# Patient Record
Sex: Male | Born: 1935 | Race: White | Hispanic: No | Marital: Married | Smoking: Never smoker
Health system: Southern US, Community
[De-identification: ages and names within clinical notes are randomized; demographics above are authoritative.]

## PROBLEM LIST (undated history)

## (undated) DIAGNOSIS — C189 Malignant neoplasm of colon, unspecified: Secondary | ICD-10-CM

## (undated) DIAGNOSIS — N400 Enlarged prostate without lower urinary tract symptoms: Secondary | ICD-10-CM

## (undated) DIAGNOSIS — F0390 Unspecified dementia without behavioral disturbance: Secondary | ICD-10-CM

## (undated) DIAGNOSIS — I10 Essential (primary) hypertension: Secondary | ICD-10-CM

## (undated) DIAGNOSIS — I5030 Unspecified diastolic (congestive) heart failure: Secondary | ICD-10-CM

## (undated) DIAGNOSIS — I4892 Unspecified atrial flutter: Secondary | ICD-10-CM

## (undated) DIAGNOSIS — M869 Osteomyelitis, unspecified: Secondary | ICD-10-CM

## (undated) HISTORY — PX: COLON SURGERY: SHX602

---

## 2009-05-17 ENCOUNTER — Emergency Department (HOSPITAL_BASED_OUTPATIENT_CLINIC_OR_DEPARTMENT_OTHER): Admission: EM | Admit: 2009-05-17 | Discharge: 2009-05-18 | Payer: Self-pay | Admitting: Emergency Medicine

## 2009-05-17 ENCOUNTER — Ambulatory Visit: Payer: Self-pay | Admitting: Diagnostic Radiology

## 2010-09-29 LAB — DIFFERENTIAL
Basophils Relative: 2 % — ABNORMAL HIGH (ref 0–1)
Eosinophils Absolute: 0.2 10*3/uL (ref 0.0–0.7)
Eosinophils Relative: 4 % (ref 0–5)
Monocytes Absolute: 0.5 10*3/uL (ref 0.1–1.0)
Monocytes Relative: 12 % (ref 3–12)

## 2010-09-29 LAB — BASIC METABOLIC PANEL
CO2: 29 mEq/L (ref 19–32)
Chloride: 106 mEq/L (ref 96–112)
Creatinine, Ser: 1.1 mg/dL (ref 0.4–1.5)
GFR calc Af Amer: >60 mL/min (ref >60)
Sodium: 144 mEq/L (ref 135–145)

## 2010-09-29 LAB — CBC
Hemoglobin: 14.1 g/dL (ref 13.0–17.0)
MCHC: 34.4 g/dL (ref 30.0–36.0)
MCV: 97.2 fL (ref 78.0–100.0)
RBC: 4.22 MIL/uL (ref 4.22–5.81)

## 2012-10-19 ENCOUNTER — Emergency Department (HOSPITAL_BASED_OUTPATIENT_CLINIC_OR_DEPARTMENT_OTHER)
Admission: EM | Admit: 2012-10-19 | Discharge: 2012-10-19 | Disposition: A | Discharge status: Home / Self Care | Payer: Medicare Other | Attending: Emergency Medicine | Admitting: Emergency Medicine

## 2012-10-19 ENCOUNTER — Encounter (HOSPITAL_BASED_OUTPATIENT_CLINIC_OR_DEPARTMENT_OTHER): Payer: Self-pay | Admitting: *Deleted

## 2012-10-19 DIAGNOSIS — R141 Gas pain: Secondary | ICD-10-CM | POA: Insufficient documentation

## 2012-10-19 DIAGNOSIS — R339 Retention of urine, unspecified: Secondary | ICD-10-CM | POA: Insufficient documentation

## 2012-10-19 DIAGNOSIS — I10 Essential (primary) hypertension: Secondary | ICD-10-CM | POA: Insufficient documentation

## 2012-10-19 DIAGNOSIS — Z859 Personal history of malignant neoplasm, unspecified: Secondary | ICD-10-CM | POA: Insufficient documentation

## 2012-10-19 DIAGNOSIS — R142 Eructation: Secondary | ICD-10-CM | POA: Insufficient documentation

## 2012-10-19 DIAGNOSIS — Z79899 Other long term (current) drug therapy: Secondary | ICD-10-CM | POA: Insufficient documentation

## 2012-10-19 DIAGNOSIS — R143 Flatulence: Secondary | ICD-10-CM | POA: Insufficient documentation

## 2012-10-19 LAB — URINALYSIS, ROUTINE W REFLEX MICROSCOPIC
Bilirubin Urine: NEGATIVE
Hgb urine dipstick: NEGATIVE
Specific Gravity, Urine: 1.017 (ref 1.005–1.030)
pH: 6 (ref 5.0–8.0)

## 2012-10-19 NOTE — ED Notes (Signed)
Urinary retention.

## 2012-10-19 NOTE — ED Provider Notes (Signed)
History     CSN: [id number]  Arrival date & time 10/19/12  1706   First MD Initiated Contact with Patient 10/19/12 1748      Chief Complaint  Patient presents with  . Urinary Retention    (Consider location/radiation/quality/duration/timing/severity/associated sxs/prior treatment) HPI  79 year old male presents today with wife and daughter state he has not been able to void his last night. He noted some increased urine output through the night and states he has only been able to void a small amount this morning and has not voided since then. He has a history of retention and states his feel like it did then. He has not had any nausea, vomiting, fever, or chills. He feels that he has some suprapubic fullness. He has eaten as usual today. He has a urologist and his primary care Dr. is Dr. Tresa Endo.  Past Medical History  Diagnosis Date  . Hypertension   . Cancer     Past Surgical History  Procedure Laterality Date  . Colon surgery      No family history on file.  History  Substance Use Topics  . Smoking status: Never Smoker   . Smokeless tobacco: Not on file  . Alcohol Use: No      Review of Systems  All other systems reviewed and are negative.    Allergies  Review of patient's allergies indicates no known allergies.  Home Medications   Current Outpatient Rx  Name  Route  Sig  Dispense  Refill  . furosemide (LASIX) 80 MG tablet   Oral   Take 80 mg by mouth 2 (two) times daily.         Marland Kitchen lisinopril (PRINIVIL,ZESTRIL) 20 MG tablet   Oral   Take 20 mg by mouth daily.           BP 155/74  Pulse 57  Temp(Src) 97.6 F (36.4 C) (Oral)  Resp 20  SpO2 94%  Physical Exam  Nursing note and vitals reviewed. Constitutional: He is oriented to person, place, and time. He appears well-developed and well-nourished.  Elderly male sitting on bed is not appear to be in any acute distress  HENT:  Head: Normocephalic and atraumatic.  Right Ear: External ear  normal.  Left Ear: External ear normal.  Nose: Nose normal.  Mouth/Throat: Oropharynx is clear and moist.  Eyes: Conjunctivae and EOM are normal.  Neck: Normal range of motion. Neck supple.  Cardiovascular: Normal rate and regular rhythm.   Pulmonary/Chest: Effort normal.  Abdominal: Soft.  Suprapubic distention consistent with her nares tension  Musculoskeletal: Normal range of motion.  Neurological: He is alert and oriented to person, place, and time. He has normal reflexes.  Skin: Skin is warm and dry.  Psychiatric: He has a normal mood and affect.    ED Course  Procedures (including critical care time)  Labs Reviewed  URINALYSIS, ROUTINE W REFLEX MICROSCOPIC   No results found.   No diagnosis found.  Foley catheter placed and patient has had her cc of urine output with resolution of symptoms. Results for orders placed during the hospital encounter of 10/19/12  URINALYSIS, ROUTINE W REFLEX MICROSCOPIC      Result Value Range   Color, Urine YELLOW  YELLOW   APPearance CLEAR  CLEAR   Specific Gravity, Urine 1.017  1.005 - 1.030   pH 6.0  5.0 - 8.0   Glucose, UA NEGATIVE  NEGATIVE mg/dL   Hgb urine dipstick NEGATIVE  NEGATIVE   Bilirubin Urine NEGATIVE  NEGATIVE   Ketones, ur NEGATIVE  NEGATIVE mg/dL   Protein, ur NEGATIVE  NEGATIVE mg/dL   Urobilinogen, UA 0.2  0.0 - 1.0 mg/dL   Nitrite NEGATIVE  NEGATIVE   Leukocytes, UA NEGATIVE  NEGATIVE      MDM  Patient without acute urinary retention relieved with Foley catheter placement. He does not have any blood, leukocytes in his urine. Urine will be cultured. Plan leg bag. I discussed with patient, daughter, and wife. He will keep the catheter in place until followup with urology on Monday or Tuesday. We have discussed signs and symptoms to return to the emergency department for including any worsening abdominal pain, fever, chills or any other concerns.       Hilario Quarry, MD 10/19/12 [phone]

## 2012-10-19 NOTE — ED Notes (Signed)
MD at bedside. 

## 2012-10-19 NOTE — ED Notes (Signed)
Pt reports feeling better after catheter insertion. Pt has put out urine. Pt also c/o "soreness" across upper abd.

## 2012-10-19 NOTE — ED Notes (Signed)
Foley bag changed to a leg bag and instructed family on use. EMT at bedside dressing patient now.

## 2014-05-20 ENCOUNTER — Telehealth: Payer: Self-pay | Admitting: Cardiovascular Disease

## 2014-05-20 NOTE — Telephone Encounter (Signed)
Kenneth Edwards at [facility] informed pt. Would have to get this medication from his PCP

## 2014-05-20 NOTE — Telephone Encounter (Signed)
Pt.s wife called and wanted a refill on his Tramadol

## 2014-11-01 ENCOUNTER — Emergency Department (HOSPITAL_COMMUNITY): Payer: Medicare Other

## 2014-11-01 ENCOUNTER — Inpatient Hospital Stay (HOSPITAL_COMMUNITY): Payer: Medicare Other

## 2014-11-01 ENCOUNTER — Encounter (HOSPITAL_COMMUNITY): Payer: Self-pay | Admitting: Emergency Medicine

## 2014-11-01 ENCOUNTER — Inpatient Hospital Stay (HOSPITAL_COMMUNITY)
Admission: EM | Admit: 2014-11-01 | Discharge: 2014-11-04 | DRG: 292 | Disposition: A | Discharge status: Skilled Nursing Facility | Payer: Medicare Other | Attending: Internal Medicine | Admitting: Internal Medicine

## 2014-11-01 DIAGNOSIS — I10 Essential (primary) hypertension: Secondary | ICD-10-CM

## 2014-11-01 DIAGNOSIS — I5033 Acute on chronic diastolic (congestive) heart failure: Principal | ICD-10-CM | POA: Diagnosis present

## 2014-11-01 DIAGNOSIS — Z9181 History of falling: Secondary | ICD-10-CM

## 2014-11-01 DIAGNOSIS — Z7901 Long term (current) use of anticoagulants: Secondary | ICD-10-CM | POA: Diagnosis not present

## 2014-11-01 DIAGNOSIS — I129 Hypertensive chronic kidney disease with stage 1 through stage 4 chronic kidney disease, or unspecified chronic kidney disease: Secondary | ICD-10-CM | POA: Diagnosis present

## 2014-11-01 DIAGNOSIS — I509 Heart failure, unspecified: Secondary | ICD-10-CM | POA: Diagnosis not present

## 2014-11-01 DIAGNOSIS — Z66 Do not resuscitate: Secondary | ICD-10-CM | POA: Diagnosis present

## 2014-11-01 DIAGNOSIS — K219 Gastro-esophageal reflux disease without esophagitis: Secondary | ICD-10-CM | POA: Diagnosis present

## 2014-11-01 DIAGNOSIS — L03116 Cellulitis of left lower limb: Secondary | ICD-10-CM | POA: Diagnosis present

## 2014-11-01 DIAGNOSIS — I483 Typical atrial flutter: Secondary | ICD-10-CM | POA: Diagnosis not present

## 2014-11-01 DIAGNOSIS — I4892 Unspecified atrial flutter: Secondary | ICD-10-CM | POA: Diagnosis present

## 2014-11-01 DIAGNOSIS — I110 Hypertensive heart disease with heart failure: Secondary | ICD-10-CM | POA: Diagnosis present

## 2014-11-01 DIAGNOSIS — Z7982 Long term (current) use of aspirin: Secondary | ICD-10-CM | POA: Diagnosis not present

## 2014-11-01 DIAGNOSIS — E119 Type 2 diabetes mellitus without complications: Secondary | ICD-10-CM | POA: Diagnosis present

## 2014-11-01 DIAGNOSIS — F0390 Unspecified dementia without behavioral disturbance: Secondary | ICD-10-CM | POA: Diagnosis not present

## 2014-11-01 DIAGNOSIS — N183 Chronic kidney disease, stage 3 unspecified: Secondary | ICD-10-CM | POA: Diagnosis present

## 2014-11-01 DIAGNOSIS — T502X5A Adverse effect of carbonic-anhydrase inhibitors, benzothiadiazides and other diuretics, initial encounter: Secondary | ICD-10-CM | POA: Diagnosis present

## 2014-11-01 DIAGNOSIS — I872 Venous insufficiency (chronic) (peripheral): Secondary | ICD-10-CM | POA: Diagnosis present

## 2014-11-01 DIAGNOSIS — F0391 Unspecified dementia with behavioral disturbance: Secondary | ICD-10-CM | POA: Diagnosis present

## 2014-11-01 DIAGNOSIS — R0602 Shortness of breath: Secondary | ICD-10-CM | POA: Insufficient documentation

## 2014-11-01 DIAGNOSIS — N400 Enlarged prostate without lower urinary tract symptoms: Secondary | ICD-10-CM | POA: Diagnosis present

## 2014-11-01 DIAGNOSIS — N170 Acute kidney failure with tubular necrosis: Secondary | ICD-10-CM | POA: Diagnosis not present

## 2014-11-01 DIAGNOSIS — C189 Malignant neoplasm of colon, unspecified: Secondary | ICD-10-CM | POA: Insufficient documentation

## 2014-11-01 DIAGNOSIS — N179 Acute kidney failure, unspecified: Secondary | ICD-10-CM | POA: Insufficient documentation

## 2014-11-01 DIAGNOSIS — R001 Bradycardia, unspecified: Secondary | ICD-10-CM | POA: Diagnosis not present

## 2014-11-01 DIAGNOSIS — I4891 Unspecified atrial fibrillation: Secondary | ICD-10-CM | POA: Diagnosis present

## 2014-11-01 DIAGNOSIS — I8312 Varicose veins of left lower extremity with inflammation: Secondary | ICD-10-CM | POA: Diagnosis not present

## 2014-11-01 DIAGNOSIS — Z85038 Personal history of other malignant neoplasm of large intestine: Secondary | ICD-10-CM | POA: Diagnosis not present

## 2014-11-01 DIAGNOSIS — N19 Unspecified kidney failure: Secondary | ICD-10-CM

## 2014-11-01 DIAGNOSIS — F03918 Unspecified dementia, unspecified severity, with other behavioral disturbance: Secondary | ICD-10-CM | POA: Diagnosis present

## 2014-11-01 DIAGNOSIS — R531 Weakness: Secondary | ICD-10-CM | POA: Diagnosis not present

## 2014-11-01 DIAGNOSIS — I8311 Varicose veins of right lower extremity with inflammation: Secondary | ICD-10-CM | POA: Diagnosis not present

## 2014-11-01 HISTORY — DX: Benign prostatic hyperplasia without lower urinary tract symptoms: N40.0

## 2014-11-01 HISTORY — DX: Unspecified atrial flutter: I48.92

## 2014-11-01 HISTORY — DX: Essential (primary) hypertension: I10

## 2014-11-01 HISTORY — DX: Malignant neoplasm of colon, unspecified: C18.9

## 2014-11-01 HISTORY — DX: Unspecified dementia, unspecified severity, without behavioral disturbance, psychotic disturbance, mood disturbance, and anxiety: F03.90

## 2014-11-01 LAB — COMPREHENSIVE METABOLIC PANEL
ALBUMIN: 3.2 g/dL — AB (ref 3.5–5.0)
ALK PHOS: 69 U/L (ref 38–126)
ALT: 9 U/L — ABNORMAL LOW (ref 17–63)
AST: 16 U/L (ref 15–41)
Anion gap: 6 (ref 5–15)
BILIRUBIN TOTAL: 0.6 mg/dL (ref 0.3–1.2)
BUN: 46 mg/dL — ABNORMAL HIGH (ref 6–20)
CHLORIDE: 107 mmol/L (ref 101–111)
CO2: 23 mmol/L (ref 22–32)
Calcium: 8.1 mg/dL — ABNORMAL LOW (ref 8.9–10.3)
Creatinine, Ser: 1.66 mg/dL — ABNORMAL HIGH (ref 0.61–1.24)
GFR calc Af Amer: 39 mL/min — ABNORMAL LOW (ref >60)
GFR calc non Af Amer: 34 mL/min — ABNORMAL LOW (ref >60)
Glucose, Bld: 99 mg/dL (ref 70–99)
POTASSIUM: 4.2 mmol/L (ref 3.5–5.1)
Sodium: 136 mmol/L (ref 135–145)
Total Protein: 6.1 g/dL — ABNORMAL LOW (ref 6.5–8.1)

## 2014-11-01 LAB — CBC WITH DIFFERENTIAL/PLATELET
Basophils Absolute: 0 10*3/uL (ref 0.0–0.1)
Basophils Relative: 1 % (ref 0–1)
EOS PCT: 3 % (ref 0–5)
Eosinophils Absolute: 0.2 10*3/uL (ref 0.0–0.7)
HEMATOCRIT: 37.9 % — AB (ref 39.0–52.0)
Hemoglobin: 12.4 g/dL — ABNORMAL LOW (ref 13.0–17.0)
LYMPHS ABS: 1.5 10*3/uL (ref 0.7–4.0)
LYMPHS PCT: 25 % (ref 12–46)
MCH: 32.3 pg (ref 26.0–34.0)
MCHC: 32.7 g/dL (ref 30.0–36.0)
MCV: 98.7 fL (ref 78.0–100.0)
MONO ABS: 0.9 10*3/uL (ref 0.1–1.0)
Monocytes Relative: 15 % — ABNORMAL HIGH (ref 3–12)
Neutro Abs: 3.4 10*3/uL (ref 1.7–7.7)
Neutrophils Relative %: 56 % (ref 43–77)
Platelets: 226 10*3/uL (ref 150–400)
RBC: 3.84 MIL/uL — AB (ref 4.22–5.81)
RDW: 14.5 % (ref 11.5–15.5)
WBC: 6 10*3/uL (ref 4.0–10.5)

## 2014-11-01 LAB — URINALYSIS, ROUTINE W REFLEX MICROSCOPIC
Bilirubin Urine: NEGATIVE
Glucose, UA: NEGATIVE mg/dL
Hgb urine dipstick: NEGATIVE
Ketones, ur: NEGATIVE mg/dL
LEUKOCYTES UA: NEGATIVE
NITRITE: NEGATIVE
PH: 5 (ref 5.0–8.0)
Protein, ur: NEGATIVE mg/dL
SPECIFIC GRAVITY, URINE: 1.012 (ref 1.005–1.030)
UROBILINOGEN UA: 0.2 mg/dL (ref 0.0–1.0)

## 2014-11-01 LAB — BRAIN NATRIURETIC PEPTIDE: B NATRIURETIC PEPTIDE 5: 365.9 pg/mL — AB (ref 0.0–100.0)

## 2014-11-01 LAB — TROPONIN I: Troponin I: 0.03 ng/mL (ref <0.031)

## 2014-11-01 LAB — TSH: TSH: 0.951 u[IU]/mL (ref 0.350–4.500)

## 2014-11-01 MED ORDER — ONDANSETRON HCL 4 MG PO TABS: 4.0000 mg | ORAL_TABLET | Freq: Four times a day (QID) | ORAL | Status: DC | PRN

## 2014-11-01 MED ORDER — SODIUM CHLORIDE 0.9 % IJ SOLN
3.0000 mL | Freq: Two times a day (BID) | INTRAMUSCULAR | Status: DC
  Administered 2014-11-01 – 2014-11-04 (×7): 3 mL via INTRAVENOUS

## 2014-11-01 MED ORDER — VITAMIN D3 25 MCG (1000 UNIT) PO TABS
1000.0000 [IU] | ORAL_TABLET | Freq: Every day | ORAL | Status: DC
  Administered 2014-11-01 – 2014-11-04 (×4): 1000 [IU] via ORAL
  Filled 2014-11-01 (×5): qty 1

## 2014-11-01 MED ORDER — ONDANSETRON HCL 4 MG/2ML IJ SOLN: 4.0000 mg | Freq: Three times a day (TID) | INTRAMUSCULAR | Status: DC | PRN

## 2014-11-01 MED ORDER — DIVALPROEX SODIUM 125 MG PO CSDR
125.0000 mg | DELAYED_RELEASE_CAPSULE | Freq: Every day | ORAL | Status: DC
  Administered 2014-11-01 – 2014-11-04 (×4): 125 mg via ORAL
  Filled 2014-11-01 (×5): qty 1

## 2014-11-01 MED ORDER — GUAIFENESIN-DM 100-10 MG/5ML PO SYRP: 5.0000 mL | ORAL_SOLUTION | ORAL | Status: DC | PRN

## 2014-11-01 MED ORDER — HEPARIN SODIUM (PORCINE) 5000 UNIT/ML IJ SOLN
5000.0000 [IU] | Freq: Three times a day (TID) | INTRAMUSCULAR | Status: DC
  Administered 2014-11-01 – 2014-11-04 (×9): 5000 [IU] via SUBCUTANEOUS
  Filled 2014-11-01 (×12): qty 1

## 2014-11-01 MED ORDER — FUROSEMIDE 10 MG/ML IJ SOLN
60.0000 mg | Freq: Two times a day (BID) | INTRAMUSCULAR | Status: DC
  Administered 2014-11-01 – 2014-11-02 (×2): 60 mg via INTRAVENOUS
  Filled 2014-11-01 (×4): qty 6

## 2014-11-01 MED ORDER — ONDANSETRON HCL 4 MG/2ML IJ SOLN: 4.0000 mg | Freq: Four times a day (QID) | INTRAMUSCULAR | Status: DC | PRN

## 2014-11-01 MED ORDER — TAMSULOSIN HCL 0.4 MG PO CAPS
0.4000 mg | ORAL_CAPSULE | Freq: Every day | ORAL | Status: DC
  Administered 2014-11-01 – 2014-11-04 (×4): 0.4 mg via ORAL
  Filled 2014-11-01 (×4): qty 1

## 2014-11-01 MED ORDER — PANTOPRAZOLE SODIUM 40 MG PO TBEC
40.0000 mg | DELAYED_RELEASE_TABLET | Freq: Every day | ORAL | Status: DC
  Administered 2014-11-01 – 2014-11-04 (×4): 40 mg via ORAL
  Filled 2014-11-01 (×3): qty 1

## 2014-11-01 MED ORDER — LORAZEPAM 0.5 MG PO TABS: 0.5000 mg | ORAL_TABLET | Freq: Three times a day (TID) | ORAL | Status: DC | PRN

## 2014-11-01 MED ORDER — ASPIRIN EC 81 MG PO TBEC
81.0000 mg | DELAYED_RELEASE_TABLET | Freq: Every day | ORAL | Status: DC
  Administered 2014-11-01 – 2014-11-04 (×4): 81 mg via ORAL
  Filled 2014-11-01 (×5): qty 1

## 2014-11-01 MED ORDER — HYDROCODONE-ACETAMINOPHEN 5-325 MG PO TABS
1.0000 | ORAL_TABLET | ORAL | Status: DC | PRN
  Administered 2014-11-02 – 2014-11-03 (×4): 2 via ORAL
  Filled 2014-11-01 (×4): qty 2

## 2014-11-01 MED ORDER — METOLAZONE 2.5 MG PO TABS
2.5000 mg | ORAL_TABLET | Freq: Every day | ORAL | Status: DC
  Administered 2014-11-01 – 2014-11-02 (×2): 2.5 mg via ORAL
  Filled 2014-11-01 (×3): qty 1

## 2014-11-01 MED ORDER — FUROSEMIDE 10 MG/ML IJ SOLN
40.0000 mg | INTRAMUSCULAR | Status: AC
  Administered 2014-11-01: 40 mg via INTRAVENOUS
  Filled 2014-11-01: qty 4

## 2014-11-01 NOTE — ED Notes (Signed)
Hospitalist bedside 

## 2014-11-01 NOTE — ED Provider Notes (Signed)
CSN: [id number]     Arrival date & time 11/01/14  6073 History   First MD Initiated Contact with Patient 11/01/14 [phone]     Chief Complaint  Patient presents with  . Leg Swelling     (Consider location/radiation/quality/duration/timing/severity/associated sxs/prior Treatment) HPI Comments: 81 year old male, history of hypertension and cancer, status post colon surgery in the past, lives by himself, is on Lasix and lisinopril. He had a nurse come to his house today to evaluate his legs for wound care, he was found to have weeping edema of his bilateral lower extremities and was transferred to the hospital for evaluation of same. The patient denies any other complaints except for generalized weakness which has been progressive over the last month since his wife passed away. There is no fevers chills nausea vomiting diarrhea coughing shortness of breath back pain headache or difficulty with vision. The patient has no known history of heart disease or congestive heart failure.  The history is provided by the patient.    Past Medical History  Diagnosis Date  . Hypertension   . Cancer    Past Surgical History  Procedure Laterality Date  . Colon surgery     History reviewed. No pertinent family history. History  Substance Use Topics  . Smoking status: Never Smoker   . Smokeless tobacco: Not on file  . Alcohol Use: No    Review of Systems  All other systems reviewed and are negative.     Allergies  Review of patient's allergies indicates no known allergies.  Home Medications   Prior to Admission medications   Medication Sig Start Date End Date Taking? Authorizing Provider  acetaminophen (TYLENOL) 500 MG tablet Take 500 mg by mouth every 6 (six) hours as needed for mild pain.   Yes Historical Provider, MD  cholecalciferol (VITAMIN D) 1000 UNITS tablet Take 1,000 Units by mouth daily.   Yes Historical Provider, MD  divalproex (DEPAKOTE SPRINKLE) 125 MG capsule Take 125 mg by mouth  daily.   Yes Historical Provider, MD  furosemide (LASIX) 20 MG tablet Take 60 mg by mouth 2 (two) times daily.   Yes Historical Provider, MD  lisinopril (PRINIVIL,ZESTRIL) 20 MG tablet Take 20 mg by mouth daily.   Yes Historical Provider, MD  LORazepam (ATIVAN) 0.5 MG tablet Take 0.5 mg by mouth every 8 (eight) hours as needed for anxiety.   Yes Historical Provider, MD  tamsulosin (FLOMAX) 0.4 MG CAPS capsule Take 0.4 mg by mouth daily.    Yes Historical Provider, MD  traMADol (ULTRAM) 50 MG tablet Take 1-2 tablets by mouth every 8 hours for pain 08/18/14  Yes Historical Provider, MD  Vitamin D, Ergocalciferol, (DRISDOL) 50000 UNITS CAPS capsule Take 50,000 Units by mouth every 7 (seven) days.   Yes Historical Provider, MD   BP 107/47 mmHg  Pulse 55  Temp(Src) 97.8 F (36.6 C) (Oral)  Resp 16  SpO2 96% Physical Exam  Constitutional: He appears well-developed and well-nourished. No distress.  HENT:  Head: Normocephalic and atraumatic.  Mouth/Throat: Oropharynx is clear and moist. No oropharyngeal exudate.  Eyes: Conjunctivae and EOM are normal. Pupils are equal, round, and reactive to light. Right eye exhibits no discharge. Left eye exhibits no discharge. No scleral icterus.  Neck: Normal range of motion. Neck supple. No JVD present. No thyromegaly present.  Cardiovascular: Normal rate, regular rhythm, normal heart sounds and intact distal pulses.  Exam reveals no gallop and no friction rub.   No murmur heard. Pulmonary/Chest: Effort normal. No  respiratory distress. He has no wheezes. He has rales (subtle rales at the bases, no increased work of breathing).  Abdominal: Soft. Bowel sounds are normal. He exhibits no distension and no mass. There is no tenderness.  Very soft abdomen, no tenderness, no guarding, mild tympanitic sounds to percussion of the mid abdomen  Musculoskeletal: Normal range of motion. He exhibits edema (severe bilateral pitting edema below the knees through the feet,  symmetrical, erythematous, several areas of weeping serous fluid). He exhibits no tenderness.  Lymphadenopathy:    He has no cervical adenopathy.  Neurological: He is alert. Coordination normal.  The patient has difficulty with hearing, he reads lips, he follows commands without difficulty, normal strength, cranial nerves III through XII intact  Skin: Skin is warm and dry. No rash noted. There is erythema.  Lower extremities with bilateral erythema over the edematous areas, no warmth or induration  Psychiatric: He has a normal mood and affect. His behavior is normal.  Nursing note and vitals reviewed.   ED Course  Procedures (including critical care time) Labs Review Labs Reviewed  BRAIN NATRIURETIC PEPTIDE - Abnormal; Notable for the following:    B Natriuretic Peptide 365.9 (*)    All other components within normal limits  COMPREHENSIVE METABOLIC PANEL - Abnormal; Notable for the following:    BUN 46 (*)    Creatinine, Ser 1.66 (*)    Calcium 8.1 (*)    Total Protein 6.1 (*)    Albumin 3.2 (*)    ALT 9 (*)    GFR calc non Af Amer 34 (*)    GFR calc Af Amer 39 (*)    All other components within normal limits  CBC WITH DIFFERENTIAL/PLATELET - Abnormal; Notable for the following:    RBC 3.84 (*)    Hemoglobin 12.4 (*)    HCT 37.9 (*)    Monocytes Relative 15 (*)    All other components within normal limits  TROPONIN I  URINALYSIS, ROUTINE W REFLEX MICROSCOPIC  TSH  TROPONIN I  TROPONIN I  TROPONIN I    Imaging Review Dg Chest 2 View  11/01/2014   CLINICAL DATA:  Bilateral leg swelling for 1 month. Open wounds. History of colon cancer. Ex-smoker.  EXAM: CHEST  2 VIEW  COMPARISON:  05/17/2009.  FINDINGS: The cardiac silhouette remains near the upper limit of normal in size. Clear lungs with the exception of stable minimal linear scarring at the left lateral lung base. Moderate scoliosis is unchanged. Interval thoracolumbar vertebroplasty material.  IMPRESSION: No acute  abnormality.   Electronically Signed   By: Claudie Revering M.D.   On: 11/01/2014 11:07     EKG Interpretation   Date/Time:  Saturday Nov 01 2014 10:14:10 EDT Ventricular Rate:  47 PR Interval:    QRS Duration: 121 QT Interval:  455 QTC Calculation: 402 R Axis:   81 Text Interpretation:  Atrial flutter with bradycardia rate controlled  Nonspecific T wave abnormality Abnormal ekg No old tracing to compare  Confirmed by Nyal Schachter  MD, Javius Sylla ([id number]) on 11/01/2014 10:47:26 AM      MDM   Final diagnoses:  SOB (shortness of breath)  Acute renal failure, unspecified acute renal failure type  Atrial flutter, unspecified    The patient has what appears to be severe bilateral lower extremity edema without any other signs of significant volume overload. Prior chest x-rays have shown bilateral atelectasis or scarring at the bases, he does not have JVD, is not orthopneic or short of breath,  we will evaluate with labs and chest x-ray, check renal function as he is on high doses of Lasix and an ACE inhibitor, he does not appear focally weak, generalized weakness could be from multiple etiologies including poor by mouth intake, renal failure, uremia, coronary ischemia.  Labs reviewed, shows acute renal failure with mild uremia, chest x-ray without acute findings, the patient has a BNP of close to 400, I am concerned given his relative bradycardia and atrial flutter, he is also on medications which could worsen renal failure, he likely needs diuresis, this will be done inpatient, discussed with hospitalist who agrees.  Meds given in ED:  Medications  furosemide (LASIX) injection 40 mg (40 mg Intravenous Given 11/01/14 1150)    New Prescriptions   No medications on file      Noemi Chapel, MD 11/01/14 1152

## 2014-11-01 NOTE — H&P (Addendum)
Patient Demographics  Kenneth Edwards, is a 81 y.o. male  MRN: [id number]   DOB - 1919/10/17  Admit Date - 11/01/2014  Outpatient Primary MD for the patient is Dr. Elvina Mattes  With History of -  Past Medical History  Diagnosis Date  . Essential hypertension   . Colon cancer   . Atrial flutter   . Dementia   . BPH (benign prostatic hyperplasia)       Past Surgical History  Procedure Laterality Date  . Colon surgery      in for   Chief Complaint  Patient presents with  . Leg Swelling     HPI  Kenneth Edwards  is a 81 y.o. male, with history of atrial flutter [place] VASC - 3 who was taken off of Coumadin few years ago because of multiple falls, essential hypertension, dementia, history of colon cancer status post resection 30 years ago,he claims that it might have reoccurred but is not sure who told him that, BPH whose wife died about a month ago and he now lives in assisted living facility primary care physician Dr. Elvina Mattes.  He comes to the ER with chief complaints of massive swelling in both legs and some fatigue, in the ER workup consistent of atrial flutter with slow ventricular response, massive lower extremity edema, elevated proBNP with relatively stable chest x-ray, renal insufficiency unclear baseline and I was called to admit the patient.   In the ER is relatively stable, pleasantly demented, besides lower extremity edema and generalized weakness he has no subjective complaints, denies any fever chills, no headache, no chest pain or abdominal pain, no cough phlegm shortness of breath. No orthopnea. He does say that intermittently he notices some blood in his stool. He thinks that someone told him few months ago that his colon cancer might have reoccurred which was operated and removed about 30 years ago. He  denies any unintentional weight loss. No history of strokes.    Review of Systems    In addition to the HPI above,  No Fever-chills, No Headache, No changes with Vision or hearing, No problems swallowing food or Liquids, No Chest pain, Cough or Shortness of Breath, No Abdominal pain, No Nausea or Vommitting, Bowel movements are regular, No Blood in stool or Urine, No dysuria, No new skin rashes or bruises, No new joints pains-aches,  No new weakness, tingling, numbness in any extremity, he agrees to massive swelling in both lower extremities and some generalized weakness No recent weight gain or loss, No polyuria, polydypsia or polyphagia, No significant Mental Stressors.  A full 10 point Review of Systems was done, except as stated above, all other Review of Systems were negative.   Social History History  Substance Use Topics  . Smoking status: Never Smoker   . Smokeless tobacco: Not on file  . Alcohol Use: No    Lives - lives alone at her assisted living facility  Mobility -  walks with a walker     Family History No family history of CAD at an early age  Prior to Admission medications   Medication Sig Start Date End Date Taking? Authorizing Provider  acetaminophen (TYLENOL) 500 MG tablet Take 500 mg by mouth every 6 (six) hours as needed for mild pain.   Yes Historical Provider, MD  cholecalciferol (VITAMIN D) 1000 UNITS tablet Take 1,000 Units by mouth daily.   Yes Historical Provider, MD  divalproex (DEPAKOTE SPRINKLE) 125 MG capsule Take 125 mg by mouth daily.   Yes Historical Provider, MD  furosemide (LASIX) 20 MG tablet Take 60 mg by mouth 2 (two) times daily.   Yes Historical Provider, MD  lisinopril (PRINIVIL,ZESTRIL) 20 MG tablet Take 20 mg by mouth daily.   Yes Historical Provider, MD  LORazepam (ATIVAN) 0.5 MG tablet Take 0.5 mg by mouth every 8 (eight) hours as needed for anxiety.   Yes Historical Provider, MD  tamsulosin (FLOMAX) 0.4 MG CAPS capsule  Take 0.4 mg by mouth daily.    Yes Historical Provider, MD  traMADol (ULTRAM) 50 MG tablet Take 1-2 tablets by mouth every 8 hours for pain 08/18/14  Yes Historical Provider, MD  Vitamin D, Ergocalciferol, (DRISDOL) 50000 UNITS CAPS capsule Take 50,000 Units by mouth every 7 (seven) days.   Yes Historical Provider, MD    No Known Allergies  Physical Exam  Vitals  Blood pressure 107/47, pulse 55, temperature 97.8 F (36.6 C), temperature source Oral, resp. rate 16, SpO2 96 %.   1. General elderly pleasantly demented white male lying in bed in NAD,  he's extremely hard of hearing  2. Normal affect and insight, Not Suicidal or Homicidal, Awake , Oriented X 1.  3. No F.N deficits, ALL C.Nerves Intact, Strength 5/5 all 4 extremities, Sensation intact all 4 extremities, Plantars down going.  4. Ears and Eyes appear Normal, Conjunctivae clear, PERRLA. Moist Oral Mucosa.  5. Supple Neck, No JVD, No cervical lymphadenopathy appriciated, No Carotid Bruits.  6. Symmetrical Chest wall movement, Good air movement bilaterally, CTAB.  7. RRR, No Gallops, Rubs or Murmurs, No Parasternal Heave.  8. Positive Bowel Sounds, Abdomen Soft, No tenderness, No organomegaly appriciated,No rebound -guarding or rigidity.  9.  No Cyanosis, Normal Skin Turgor, 3+ leg edema, with chronic erythema and some small weeping areas, no warmth  10. Good muscle tone,  joints appear normal , no effusions, Normal ROM.  11. No Palpable Lymph Nodes in Neck or Axillae     Data Review  CBC  Recent Labs Lab 11/01/14 1038  WBC 6.0  HGB 12.4*  HCT 37.9*  PLT 226  MCV 98.7  MCH 32.3  MCHC 32.7  RDW 14.5  LYMPHSABS 1.5  MONOABS 0.9  EOSABS 0.2  BASOSABS 0.0   ------------------------------------------------------------------------------------------------------------------  Chemistries   Recent Labs Lab 11/01/14 1038  NA 136  K 4.2  CL 107  CO2 23  GLUCOSE 99  BUN 46*  CREATININE 1.66*  CALCIUM  8.1*  AST 16  ALT 9*  ALKPHOS 69  BILITOT 0.6   ------------------------------------------------------------------------------------------------------------------ CrCl cannot be calculated (Unknown ideal weight.). ------------------------------------------------------------------------------------------------------------------ No results for input(s): TSH, T4TOTAL, T3FREE, THYROIDAB in the last 72 hours.  Invalid input(s): FREET3   Coagulation profile No results for input(s): INR, PROTIME in the last 168 hours. ------------------------------------------------------------------------------------------------------------------- No results for input(s): DDIMER in the last 72 hours. -------------------------------------------------------------------------------------------------------------------  Cardiac Enzymes  Recent Labs Lab 11/01/14 1038  TROPONINI 0.03   ------------------------------------------------------------------------------------------------------------------ Invalid input(s): POCBNP   ---------------------------------------------------------------------------------------------------------------  Urinalysis    Component Value Date/Time   COLORURINE YELLOW 10/19/2012 1810   APPEARANCEUR CLEAR 10/19/2012 1810   LABSPEC 1.017 10/19/2012 1810   PHURINE 6.0 10/19/2012 1810   GLUCOSEU NEGATIVE 10/19/2012 1810   HGBUR NEGATIVE 10/19/2012 1810   BILIRUBINUR NEGATIVE 10/19/2012 1810   KETONESUR NEGATIVE 10/19/2012 1810   PROTEINUR NEGATIVE 10/19/2012 1810   UROBILINOGEN 0.2 10/19/2012 1810   NITRITE NEGATIVE 10/19/2012 1810   LEUKOCYTESUR NEGATIVE 10/19/2012 1810    ----------------------------------------------------------------------------------------------------------------  Imaging results:   Dg Chest 2 View  11/01/2014   CLINICAL DATA:  Bilateral leg swelling for 1 month. Open wounds. History of colon cancer. Ex-smoker.  EXAM: CHEST  2 VIEW  COMPARISON:   05/17/2009.  FINDINGS: The cardiac silhouette remains near the upper limit of normal in size. Clear lungs with the exception of stable minimal linear scarring at the left lateral lung base. Moderate scoliosis is unchanged. Interval thoracolumbar vertebroplasty material.  IMPRESSION: No acute abnormality.   Electronically Signed   By: Claudie Revering M.D.   On: 11/01/2014 11:07    My personal review of EKG: Rhythm A.flutter, Rate  47 /min, non specific ST changes    Assessment & Plan   1. Bilateral lower extremity edema due to CHF decompensation. Acute on chronic. Unclear type as no echogram in the chart. Due to relatively clear lung fields this might be largely right-sided failure. He will be admitted to a telemetry bed, salt and fluid restriction, Zaroxolyn along with IV Lasix, daily weights, intake output monitor. Apply condom catheter. Monitor BMP closely. Blood pressure low for hydralazine, no beta blocker/Calcium channel blocker as heart rate low.  2. Atrial flutter with slow ventricular response [place] VASC - 3  - slow ventricular response could be causing his fatigue, we'll check TSH, cycle troponin, telemetry monitor, check echogram. Aspirin for now, note he was taken off of Coumadin 3 years ago due to falls. His cardiologist was in [facility] he does not recall his name. Case discussed with cardiologist on-call doctor skeins who requested patient be sent to [facility] for close cardiology and possible EP follow-up. May require pacemaker based on his overall clinical progress. Although DO NOT RESUSCITATE he seems to have at least a decent quality of life for his age.  3. BPH. Continue Flomax.  4. Dementia. At risk for delirium. Minimize benzodiazepines and narcotics.  5. History of colon cancer. Status post resection 30 years ago. Denies any unintentional weight loss, some intermittent bright red blood per stool, placing on 81 mg of aspirin for #2 above along with PPI. If any evidence of bleed  we will discontinue even the aspirin. Will defer outpatient workup for any recurrence of colon cancer to PCP, doubt he is a candidate for extensive workup, chemoradiation or surgery.  6. Essential hypertension. Blood pressure is soft. We will hold off medications for now.  7. Renal failure. No baseline kidney function in the system. At present he definitely needs diuresis due to massive leg edema, with diabetes monitor BMP. Will check a baseline renal ultrasound. Repeat BMP in the morning.     DVT Prophylaxis Heparin    AM Labs Ordered, also please review Full Orders  Family Communication: Admission, patients condition and plan of care including tests being ordered have been discussed with the patient and daughter who indicate understanding and agree with the plan and Code Status.  Code Status DNR  Likely DC to  ALF/SNF  Condition GUARDED    Time spent in minutes :  22    Thurnell Lose M.D on 11/01/2014 at 12:26 PM  Between 7am to 7pm - Pager - [phone]  After 7pm go to [email or web address] - password [facility]  [facility]  Office  [phone]

## 2014-11-01 NOTE — ED Notes (Signed)
Unable to void

## 2014-11-01 NOTE — ED Notes (Signed)
PER EMS- pt picked up from [facility] c/o bilateral leg swelling x1 month.  Weeping profusely, with open sores noted. Non complaint with wound care.  Home health nurse came out to the home today and requested that pt be evaluated before she starts treating pt regularly.  Reports 4/10 intermittent pain.  Alert and oriented per baseline.  Pt will be transferred to an assisted nursing facility next week. Wife recently died. Pt is DNR.

## 2014-11-01 NOTE — Consult Note (Signed)
Admit date: 11/01/2014 Referring Physician  Candiss Norse Primary Physician No primary care provider on file. Primary Cardiologist  [place] Reason for Consultation  ? pacer  HPI: 81 year old male with dementia, atrial flutter taken off of Coumadin a few years ago because of multiple falls with remote history of colon cancer resected 30 years ago that might have reoccurred but unsure who told him that his wife died about a month ago and lives in assisted living facility taken care of by Dr. Elvina Mattes here with chief complaint of swelling in both legs as well as some fatigue. Atrial fibrillation was noted on EKG with slow ventricular response, heart rate 47 bpm.  He is not on any rate slowing agents. He does have a cardiologist in [facility] but he does not remember his name. We were asked to see him for the possibility of pacemaker. Unknown EF.  Daughter is present. He is very pleasant currently eating his  Cobler in bed without any difficulty. No shortness of breath.  PMH:   Past Medical History  Diagnosis Date  . Essential hypertension   . Colon cancer   . Atrial flutter   . Dementia   . BPH (benign prostatic hyperplasia)     PSH:   Past Surgical History  Procedure Laterality Date  . Colon surgery     Allergies:  Review of patient's allergies indicates no known allergies. Prior to Admit Meds:   Prior to Admission medications   Medication Sig Start Date End Date Taking? Authorizing Provider  acetaminophen (TYLENOL) 500 MG tablet Take 500 mg by mouth every 6 (six) hours as needed for mild pain.   Yes Historical Provider, MD  cholecalciferol (VITAMIN D) 1000 UNITS tablet Take 1,000 Units by mouth daily.   Yes Historical Provider, MD  divalproex (DEPAKOTE SPRINKLE) 125 MG capsule Take 125 mg by mouth daily.   Yes Historical Provider, MD  furosemide (LASIX) 20 MG tablet Take 60 mg by mouth 2 (two) times daily.   Yes Historical Provider, MD  lisinopril (PRINIVIL,ZESTRIL) 20 MG tablet Take  20 mg by mouth daily.   Yes Historical Provider, MD  LORazepam (ATIVAN) 0.5 MG tablet Take 0.5 mg by mouth every 8 (eight) hours as needed for anxiety.   Yes Historical Provider, MD  tamsulosin (FLOMAX) 0.4 MG CAPS capsule Take 0.4 mg by mouth daily.    Yes Historical Provider, MD  traMADol (ULTRAM) 50 MG tablet Take 1-2 tablets by mouth every 8 hours for pain 08/18/14  Yes Historical Provider, MD  Vitamin D, Ergocalciferol, (DRISDOL) 50000 UNITS CAPS capsule Take 50,000 Units by mouth every 7 (seven) days.   Yes Historical Provider, MD   Fam HX:   History reviewed. No pertinent family history. Social HX:    History   Social History  . Marital Status: Married    Spouse Name: N/A  . Number of Children: N/A  . Years of Education: N/A   Occupational History  . Not on file.   Social History Main Topics  . Smoking status: Never Smoker   . Smokeless tobacco: Not on file  . Alcohol Use: No  . Drug Use: No  . Sexual Activity: Not on file   Other Topics Concern  . Not on file   Social History Narrative     ROS:  All 11 ROS were addressed and are negative except what is stated in the HPI   Physical Exam: Blood pressure 139/55, pulse 53, temperature 98.2 F (36.8 C), temperature source  Oral, resp. rate 16, height 5\' 10"  (1.778 m), weight 151 lb 7.3 oz (68.7 kg), SpO2 94 %.   General: Elderly, in no acute distress Head: Eyes PERRLA, No xanthomas.   Normal cephalic and atramatic  Lungs:   Clear bilaterally to auscultation and percussion. Normal respiratory effort. No wheezes, no rales. Heart:  Bradycardic, fairly regular S1 S2 Pulses are 2+ & equal. No murmur, rubs, gallops.  No carotid bruit. No JVD.  No abdominal bruits.  Abdomen: Bowel sounds are positive, abdomen soft and non-tender without masses. No hepatosplenomegaly. Msk:  Back normal. Normal strength and tone for age. Extremities:  2-3+ edema lower extremities bilaterally Neuro: Dementia, non-focal, MAE x 4 GU:  Deferred Rectal: Deferred Psych:  Very pleasant disposition.      Labs: Lab Results  Component Value Date   WBC 6.0 11/01/2014   HGB 12.4* 11/01/2014   HCT 37.9* 11/01/2014   MCV 98.7 11/01/2014   PLT 226 11/01/2014     Recent Labs Lab 11/01/14 1038  NA 136  K 4.2  CL 107  CO2 23  BUN 46*  CREATININE 1.66*  CALCIUM 8.1*  PROT 6.1*  BILITOT 0.6  ALKPHOS 69  ALT 9*  AST 16  GLUCOSE 99    Recent Labs  11/01/14 1038  TROPONINI 0.03   No results found for: CHOL, HDL, LDLCALC, TRIG Lab Results  Component Value Date   DDIMER * 05/17/2009    1.02        AT THE INHOUSE ESTABLISHED CUTOFF VALUE OF 0.48 ug/mL FEU, THIS ASSAY HAS BEEN DOCUMENTED IN THE LITERATURE TO HAVE A SENSITIVITY AND NEGATIVE PREDICTIVE VALUE OF AT LEAST 98 TO 99%.  THE TEST RESULT SHOULD BE CORRELATED WITH AN ASSESSMENT OF THE CLINICAL PROBABILITY OF DVT / VTE.     Radiology:  Dg Chest 2 View  11/01/2014   CLINICAL DATA:  Bilateral leg swelling for 1 month. Open wounds. History of colon cancer. Ex-smoker.  EXAM: CHEST  2 VIEW  COMPARISON:  05/17/2009.  FINDINGS: The cardiac silhouette remains near the upper limit of normal in size. Clear lungs with the exception of stable minimal linear scarring at the left lateral lung base. Moderate scoliosis is unchanged. Interval thoracolumbar vertebroplasty material.  IMPRESSION: No acute abnormality.   Electronically Signed   By: Claudie Revering M.D.   On: 11/01/2014 11:07   US Renal  11/01/2014   CLINICAL DATA:  Renal failure.  EXAM: RENAL / URINARY TRACT ULTRASOUND COMPLETE  COMPARISON:  CT 05/19/2009.  FINDINGS: Right Kidney:  Length: 10.7 cm. Renal cortical thinning. Upper pole cyst measuring 14 mm x 8 mm x 12 mm. No obstruction. No calculi.  Left Kidney:  Length: 10.4 cm. Renal cortical thinning compatible with medical renal disease. Lower pole cyst measuring 29 mm x 22 mm x 24 mm.  Bladder:  Bladder volume 762 mL. Bladder wall diverticula and cellules  compatible with chronic bladder outflow obstruction. No bladder stones. By report, patient voided 600 mL prior to exam.  Small amount of ascites is incidentally noted suggesting volume overload.  IMPRESSION: 1. Renal cortical thinning and echogenicity compatible with medical renal disease. No obstruction. 2. Bladder trabeculation compatible with chronic bladder outflow obstruction. Postvoid bladder volume 762 mL, consistent with chronic obstruction.   Electronically Signed   By: Dereck Ligas M.D.   On: 11/01/2014 13:35   Personally viewed.  EKG:  Atrial fibrillation with slow ventricular response, heart rate 47 bpm, poor R-wave progression Personally viewed.  ASSESSMENT/PLAN:    81 year old male with dementia, prior history of colon cancer, chronic kidney disease presumed stage III, history of atrial fibrillation off of Coumadin because of multiple falls here with lower extremity edema and bradycardia, atrial fibrillation with slow ventricular response heart rate 47.  1. Bradycardia-currently heart rate in the low 50s. TSH pending. Avoid AV nodal blocking agents. At this time, I would continue monitoring him. I do not think that pacemaker placement is absolutely necessary given his moderate bradycardia. We will monitor. Discussed with daughter.  2. Dementia-per primary team, this also complicates possible invasive procedure  3. Chronic kidney disease-per primary team. Monitor closely.  4. Massive lower extremity edema-agree with diuresis. Watch creatinine.  5. Atrial fibrillation- CHADS-VASc at least 3. Not a Coumadin candidate. Previously taken off.  We will follow along.  Candee Furbish, MD  11/01/2014  5:41 PM

## 2014-11-01 NOTE — ED Notes (Signed)
Bed: WA06 Expected date:  Expected time:  Means of arrival:  Comments: Weeping Edema

## 2014-11-02 ENCOUNTER — Inpatient Hospital Stay (HOSPITAL_COMMUNITY): Payer: Medicare Other

## 2014-11-02 ENCOUNTER — Other Ambulatory Visit (HOSPITAL_COMMUNITY): Payer: Medicare Other

## 2014-11-02 DIAGNOSIS — R0602 Shortness of breath: Secondary | ICD-10-CM

## 2014-11-02 DIAGNOSIS — N400 Enlarged prostate without lower urinary tract symptoms: Secondary | ICD-10-CM

## 2014-11-02 DIAGNOSIS — K219 Gastro-esophageal reflux disease without esophagitis: Secondary | ICD-10-CM | POA: Insufficient documentation

## 2014-11-02 DIAGNOSIS — N179 Acute kidney failure, unspecified: Secondary | ICD-10-CM | POA: Insufficient documentation

## 2014-11-02 LAB — BASIC METABOLIC PANEL
Anion gap: 7 (ref 5–15)
BUN: 47 mg/dL — AB (ref 6–20)
CALCIUM: 8.4 mg/dL — AB (ref 8.9–10.3)
CO2: 29 mmol/L (ref 22–32)
Chloride: 104 mmol/L (ref 101–111)
Creatinine, Ser: 1.75 mg/dL — ABNORMAL HIGH (ref 0.61–1.24)
GFR, EST AFRICAN AMERICAN: 37 mL/min — AB (ref >60)
GFR, EST NON AFRICAN AMERICAN: 32 mL/min — AB (ref >60)
GLUCOSE: 106 mg/dL — AB (ref 70–99)
POTASSIUM: 4.2 mmol/L (ref 3.5–5.1)
Sodium: 140 mmol/L (ref 135–145)

## 2014-11-02 LAB — CBC
HCT: 36 % — ABNORMAL LOW (ref 39.0–52.0)
HEMOGLOBIN: 11.9 g/dL — AB (ref 13.0–17.0)
MCH: 31.7 pg (ref 26.0–34.0)
MCHC: 33.1 g/dL (ref 30.0–36.0)
MCV: 96 fL (ref 78.0–100.0)
Platelets: 219 10*3/uL (ref 150–400)
RBC: 3.75 MIL/uL — ABNORMAL LOW (ref 4.22–5.81)
RDW: 14.3 % (ref 11.5–15.5)
WBC: 6.1 10*3/uL (ref 4.0–10.5)

## 2014-11-02 MED ORDER — FUROSEMIDE 10 MG/ML IJ SOLN
40.0000 mg | Freq: Two times a day (BID) | INTRAMUSCULAR | Status: DC
Start: 2014-11-02 — End: 2014-11-03
  Administered 2014-11-02 – 2014-11-03 (×2): 40 mg via INTRAVENOUS
  Filled 2014-11-02 (×2): qty 4

## 2014-11-02 NOTE — Progress Notes (Signed)
  Echocardiogram 2D Echocardiogram has been performed.  Kenneth Edwards M 11/02/2014, 10:36 AM

## 2014-11-02 NOTE — Progress Notes (Addendum)
[facility] PROGRESS NOTE Interim History: 81 y.o. male, with history of atrial flutter [place] VASC -3 who was taken off of Coumadin few years ago because of multiple falls, essential hypertension, dementia, history of colon cancer status post resection 30 years ago,he claims that it might have reoccurred but is not sure who told him that and BPH; whose wife died about a month ago and he now lives in assisted living facility primary care physician Dr. Elvina Mattes.  He comes to the ER with chief complaints of massive swelling in both legs, mild SOB and some fatigue, in the ER workup consistent of atrial flutter with slow ventricular response, massive lower extremity edema, elevated proBNP with relatively stable chest x-ray and renal insufficiency unclear baseline.  Filed Weights   11/01/14 1300 11/02/14 0543  Weight: 68.7 kg (151 lb 7.3 oz) 67.939 kg (149 lb 12.5 oz)        Intake/Output Summary (Last 24 hours) at 11/02/14 1343 Last data filed at 11/02/14 1000  Gross per 24 hour  Intake    960 ml  Output   2750 ml  Net  -1790 ml     Assessment/Plan: 1-acute on chronic CHF: unknown type at this point. Waiting on echo results. Will suspect diastolic in nature with right side failure component. -will continue daily weights, low sodium diet and strict intake and output -will continue lasix IV, but will decrease dose -stop metolazone -patient renal function is rising -he had almost 2L diuresis overnight -cardiology on board; will follow rec's -no B-blockers given bradycardia  2-Atrial flutter: with slow ventricular response -cardiology on board, will follow rec's -avoid any agent that can slow down/block AV nodal -no pacemaker needed currently  -continue telemetry -troponin neg -follow Echo results  3-Essential hypertension: stable -will monitor VS -currently on lasix  4-BPH: continue flomax  5-Acute on chronic Renal failure: unclear stage as no data available. But base on  GFR at least stage 3 -will discontinue metolazone -Cr slightly worse today -will decrease lasix -follow Cr trend  6-Dementia with mild behavioral disturbances per history: stable and pleasant -continue supportive care -continue depakote and PRN ativan  7-GERD: will continue PPI   Code Status: DNR Family Communication: no family at bedside  Disposition Plan: to be determine    Consultants:  Cardiology   Procedures: ECHO: pending   Antibiotics:  None   HPI/Subjective: Feeling better and with improvement in his breathing and leg swelling. No CP or fever   Objective: Filed Vitals:   11/01/14 1232 11/01/14 1300 11/01/14 2052 11/02/14 0543  BP: 120/54 139/55 129/57 128/64  Pulse: 56 53 71 68  Temp:  98.2 F (36.8 C) 98.3 F (36.8 C) 98 F (36.7 C)  TempSrc:  Oral Oral Oral  Resp: 16 16 16 16   Height:  5\' 10"  (1.778 m)    Weight:  68.7 kg (151 lb 7.3 oz)  67.939 kg (149 lb 12.5 oz)  SpO2: 96% 94% 96% 100%     Exam:  General: Alert, awake, oriented x2, in no acute distress. Denies CP and reports breathing has improved. LE swelling also better HEENT: No bruits, no goiter; mild JVD on exam Heart: bradycardia; no rubs or gallops; no murmurs. Per telemetry evaluation, patient on A. Flutter (2:1) Lungs: bibasilar crackles on exam, no wheezing. Positive scattered rhonchi  Abdomen: Soft, nontender, nondistended, positive bowel sounds.  Extremities: 2+ edema LE bilaterally; positive weeping changes and chronic stasis dermatitis  Neuro: Grossly intact, nonfocal.   Data Reviewed: Basic  Metabolic Panel:  Recent Labs Lab 11/01/14 1038 11/02/14 0416  NA 136 140  K 4.2 4.2  CL 107 104  CO2 23 29  GLUCOSE 99 106*  BUN 46* 47*  CREATININE 1.66* 1.75*  CALCIUM 8.1* 8.4*   Liver Function Tests:  Recent Labs Lab 11/01/14 1038  AST 16  ALT 9*  ALKPHOS 69  BILITOT 0.6  PROT 6.1*  ALBUMIN 3.2*   CBC:  Recent Labs Lab 11/01/14 1038 11/02/14 0416  WBC  6.0 6.1  NEUTROABS 3.4  --   HGB 12.4* 11.9*  HCT 37.9* 36.0*  MCV 98.7 96.0  PLT 226 219   Cardiac Enzymes:  Recent Labs Lab 11/01/14 1038  TROPONINI 0.03   BNP (last 3 results)  Recent Labs  11/01/14 1038  BNP 365.9*   Studies: Dg Chest 2 View  11/01/2014   CLINICAL DATA:  Bilateral leg swelling for 1 month. Open wounds. History of colon cancer. Ex-smoker.  EXAM: CHEST  2 VIEW  COMPARISON:  05/17/2009.  FINDINGS: The cardiac silhouette remains near the upper limit of normal in size. Clear lungs with the exception of stable minimal linear scarring at the left lateral lung base. Moderate scoliosis is unchanged. Interval thoracolumbar vertebroplasty material.  IMPRESSION: No acute abnormality.   Electronically Signed   By: Claudie Revering M.D.   On: 11/01/2014 11:07   US Renal  11/01/2014   CLINICAL DATA:  Renal failure.  EXAM: RENAL / URINARY TRACT ULTRASOUND COMPLETE  COMPARISON:  CT 05/19/2009.  FINDINGS: Right Kidney:  Length: 10.7 cm. Renal cortical thinning. Upper pole cyst measuring 14 mm x 8 mm x 12 mm. No obstruction. No calculi.  Left Kidney:  Length: 10.4 cm. Renal cortical thinning compatible with medical renal disease. Lower pole cyst measuring 29 mm x 22 mm x 24 mm.  Bladder:  Bladder volume 762 mL. Bladder wall diverticula and cellules compatible with chronic bladder outflow obstruction. No bladder stones. By report, patient voided 600 mL prior to exam.  Small amount of ascites is incidentally noted suggesting volume overload.  IMPRESSION: 1. Renal cortical thinning and echogenicity compatible with medical renal disease. No obstruction. 2. Bladder trabeculation compatible with chronic bladder outflow obstruction. Postvoid bladder volume 762 mL, consistent with chronic obstruction.   Electronically Signed   By: Dereck Ligas M.D.   On: 11/01/2014 13:35    Scheduled Meds: . aspirin EC  81 mg Oral Daily  . cholecalciferol  1,000 Units Oral Daily  . divalproex  125 mg Oral  Daily  . furosemide  40 mg Intravenous BID  . heparin  5,000 Units Subcutaneous 3 times per day  . pantoprazole  40 mg Oral Daily  . sodium chloride  3 mL Intravenous Q12H  . tamsulosin  0.4 mg Oral Daily   Continuous Infusions:   Time: 30 minutes  [name]  [facility] Pager [phone]. If 7PM-7AM, please contact night-coverage at [email or web address], password [facility] 11/02/2014, 1:43 PM  LOS: 1 day

## 2014-11-02 NOTE — Progress Notes (Signed)
Patient Name: Kenneth Edwards Date of Encounter: 11/02/2014  Principal Problem:   Atrial flutter Active Problems:   Acute on chronic diastolic CHF (congestive heart failure), NYHA class 2   Essential hypertension   Renal failure   Dementia   CHF (congestive heart failure)   Primary Cardiologist: Dr Marlou Porch here, generally [facility]   Patient Profile: 81 yo male w/ dementia, afib (no coumadin 2nd falls), colon CA, admitted 05/07 w/ LE edema and afib, slow VR.  SUBJECTIVE: C/o pain both lower legs, denies chest pain or SOB  OBJECTIVE Filed Vitals:   11/01/14 1232 11/01/14 1300 11/01/14 2052 11/02/14 0543  BP: 120/54 139/55 129/57 128/64  Pulse: 56 53 71 68  Temp:  98.2 F (36.8 C) 98.3 F (36.8 C) 98 F (36.7 C)  TempSrc:  Oral Oral Oral  Resp: 16 16 16 16   Height:  5\' 10"  (1.778 m)    Weight:  151 lb 7.3 oz (68.7 kg)  149 lb 12.5 oz (67.939 kg)  SpO2: 96% 94% 96% 100%    Intake/Output Summary (Last 24 hours) at 11/02/14 0916 Last data filed at 11/02/14 0600  Gross per 24 hour  Intake    720 ml  Output   3150 ml  Net  -2430 ml   Filed Weights   11/01/14 1300 11/02/14 0543  Weight: 151 lb 7.3 oz (68.7 kg) 149 lb 12.5 oz (67.939 kg)    PHYSICAL EXAM General: Well developed, well nourished, male in no acute distress. Head: Normocephalic, atraumatic.  Neck: Supple without bruits, JVD minimal elevation Lungs:  Resp regular and unlabored, few rales bases. Heart: Irreg irreg, S1, S2, no S3, S4, or murmur; no rub. Abdomen: Soft, non-tender, non-distended, BS + x 4.  Extremities: No clubbing, cyanosis, 1+ bilateral LE edema, erythema Neuro: Alert and oriented X 3. Moves all extremities spontaneously. Psych: Normal affect.  LABS: CBC: Recent Labs  11/01/14 1038 11/02/14 0416  WBC 6.0 6.1  NEUTROABS 3.4  --   HGB 12.4* 11.9*  HCT 37.9* 36.0*  MCV 98.7 96.0  PLT 226 503   Basic Metabolic Panel: Recent Labs  11/01/14 1038 11/02/14 0416  NA 136 140  K 4.2 4.2    CL 107 104  CO2 23 29  GLUCOSE 99 106*  BUN 46* 47*  CREATININE 1.66* 1.75*  CALCIUM 8.1* 8.4*   Liver Function Tests: Recent Labs  11/01/14 1038  AST 16  ALT 9*  ALKPHOS 69  BILITOT 0.6  PROT 6.1*  ALBUMIN 3.2*   Cardiac Enzymes: Recent Labs  11/01/14 1038  TROPONINI 0.03   BNP:  B NATRIURETIC PEPTIDE  Date/Time Value Ref Range Status  11/01/2014 10:38 AM 365.9* 0.0 - 100.0 pg/mL Final   Thyroid Function Tests: Recent Labs  11/01/14 1218  TSH 0.951   TELE: Atrial fib, HR not sustained < 50, no pauses > 3 sec seen       Radiology/Studies: Dg Chest 2 View 11/01/2014   CLINICAL DATA:  Bilateral leg swelling for 1 month. Open wounds. History of colon cancer. Ex-smoker.  EXAM: CHEST  2 VIEW  COMPARISON:  05/17/2009.  FINDINGS: The cardiac silhouette remains near the upper limit of normal in size. Clear lungs with the exception of stable minimal linear scarring at the left lateral lung base. Moderate scoliosis is unchanged. Interval thoracolumbar vertebroplasty material.  IMPRESSION: No acute abnormality.   Electronically Signed   By: Claudie Revering M.D.   On: 11/01/2014 11:07   US Renal 11/01/2014  CLINICAL DATA:  Renal failure.  EXAM: RENAL / URINARY TRACT ULTRASOUND COMPLETE  COMPARISON:  CT 05/19/2009.  FINDINGS: Right Kidney:  Length: 10.7 cm. Renal cortical thinning. Upper pole cyst measuring 14 mm x 8 mm x 12 mm. No obstruction. No calculi.  Left Kidney:  Length: 10.4 cm. Renal cortical thinning compatible with medical renal disease. Lower pole cyst measuring 29 mm x 22 mm x 24 mm.  Bladder:  Bladder volume 762 mL. Bladder wall diverticula and cellules compatible with chronic bladder outflow obstruction. No bladder stones. By report, patient voided 600 mL prior to exam.  Small amount of ascites is incidentally noted suggesting volume overload.  IMPRESSION: 1. Renal cortical thinning and echogenicity compatible with medical renal disease. No obstruction. 2. Bladder  trabeculation compatible with chronic bladder outflow obstruction. Postvoid bladder volume 762 mL, consistent with chronic obstruction.   Electronically Signed   By: Dereck Ligas M.D.   On: 11/01/2014 13:35     Current Medications:  . aspirin EC  81 mg Oral Daily  . cholecalciferol  1,000 Units Oral Daily  . divalproex  125 mg Oral Daily  . furosemide  60 mg Intravenous BID  . heparin  5,000 Units Subcutaneous 3 times per day  . metolazone  2.5 mg Oral Daily  . pantoprazole  40 mg Oral Daily  . sodium chloride  3 mL Intravenous Q12H  . tamsulosin  0.4 mg Oral Daily      ASSESSMENT AND PLAN: Principal Problem:   Atrial flutter - Seems mostly atrial fib - rate is controlled, no HR sustained < 50, no pauses > 3 sec, no evidence of chronotropic incompetence  Active Problems:   Acute on chronic diastolic CHF (congestive heart failure), NYHA class 2 - does not seem that wet right now, med changes per MD  Otherwise, per IM   Essential hypertension   Renal failure   Dementia  Signed, Lenoard Aden 9:16 AM 11/02/2014  Personally seen and examined. Agree with above. Legs weeping prior Improved Bradycardia noted. Stable. No pacemaker at this point.  Watch for any evidence of bladder distension.  No coumadin Pleasant  Spoke with Dr. Dyann Kief. Decreasing lasix. Stopping metolazone.   Candee Furbish, MD

## 2014-11-03 DIAGNOSIS — N170 Acute kidney failure with tubular necrosis: Secondary | ICD-10-CM

## 2014-11-03 DIAGNOSIS — F0391 Unspecified dementia with behavioral disturbance: Secondary | ICD-10-CM

## 2014-11-03 LAB — BASIC METABOLIC PANEL
Anion gap: 8 (ref 5–15)
BUN: 50 mg/dL — AB (ref 6–20)
CALCIUM: 8.4 mg/dL — AB (ref 8.9–10.3)
CHLORIDE: 101 mmol/L (ref 101–111)
CO2: 31 mmol/L (ref 22–32)
CREATININE: 1.82 mg/dL — AB (ref 0.61–1.24)
GFR calc Af Amer: 35 mL/min — ABNORMAL LOW (ref >60)
GFR, EST NON AFRICAN AMERICAN: 30 mL/min — AB (ref >60)
Glucose, Bld: 140 mg/dL — ABNORMAL HIGH (ref 70–99)
Potassium: 3.8 mmol/L (ref 3.5–5.1)
Sodium: 140 mmol/L (ref 135–145)

## 2014-11-03 MED ORDER — PANTOPRAZOLE SODIUM 40 MG PO TBEC: 40.0000 mg | DELAYED_RELEASE_TABLET | Freq: Every day | ORAL | Status: AC

## 2014-11-03 MED ORDER — LORAZEPAM 0.5 MG PO TABS: 0.5000 mg | ORAL_TABLET | Freq: Three times a day (TID) | ORAL | Status: AC | PRN

## 2014-11-03 MED ORDER — TRAMADOL HCL 50 MG PO TABS: 50.0000 mg | ORAL_TABLET | Freq: Four times a day (QID) | ORAL | Status: DC | PRN

## 2014-11-03 MED ORDER — ASPIRIN 81 MG PO TBEC: 81.0000 mg | DELAYED_RELEASE_TABLET | Freq: Every day | ORAL | Status: AC

## 2014-11-03 NOTE — Progress Notes (Signed)
Heart Failure Navigator Consult Note  Presentation: Kenneth Edwards presented with is a 81 y.o. male, with history of atrial flutter [place] VASC - 3 who was taken off of Coumadin few years ago because of multiple falls, essential hypertension, dementia, history of colon cancer status post resection 30 years ago,he claims that it might have reoccurred but is not sure who told him that, BPH whose wife died about a month ago and he now lives in assisted living facility primary care physician Dr. Elvina Mattes.  He comes to the ER with chief complaints of massive swelling in both legs and some fatigue, in the ER workup consistent of atrial flutter with slow ventricular response, massive lower extremity edema, elevated proBNP with relatively stable chest x-ray, renal insufficiency unclear baseline and I was called to admit the patient.   In the ER is relatively stable, pleasantly demented, besides lower extremity edema and generalized weakness he has no subjective complaints, denies any fever chills, no headache, no chest pain or abdominal pain, no cough phlegm shortness of breath. No orthopnea. He does say that intermittently he notices some blood in his stool. He thinks that someone told him few months ago that his colon cancer might have reoccurred which was operated and removed about 30 years ago. He denies any unintentional weight loss. No history of strokes   Past Medical History  Diagnosis Date  . Essential hypertension   . Colon cancer   . Atrial flutter   . Dementia   . BPH (benign prostatic hyperplasia)     History   Social History  . Marital Status: Married    Spouse Name: N/A  . Number of Children: N/A  . Years of Education: N/A   Social History Main Topics  . Smoking status: Never Smoker   . Smokeless tobacco: Not on file  . Alcohol Use: No  . Drug Use: No  . Sexual Activity: Not on file   Other Topics Concern  . None   Social History Narrative    ECHO:Study  Conclusions--11/02/14  - Left ventricle: The cavity size was normal. Wall thickness was increased in a pattern of mild LVH. Systolic function was normal. The estimated ejection fraction was in the range of 55% to 60%. Regional wall motion abnormalities cannot be excluded. - Left atrium: The atrium was mildly dilated.  Transthoracic echocardiography. M-mode, limited 2D, limited spectral Doppler, and color Doppler. Birthdate: Patient birthdate: 1919/09/20. Age: Patient is 81 yr old. Sex: Gender: male.  BMI: 21.3 kg/m^2. Blood pressure:   128/64 Patient status: Inpatient. Study date: Study date: 11/02/2014. Study time: 01:04 PM. Location: Bedside.  BNP    Component Value Date/Time   BNP 365.9* 11/01/2014 1038    ProBNP No results found for: PROBNP   Education Assessment and Provision:  Detailed education and instructions provided on heart failure disease management including the following:  Signs and symptoms of Heart Failure When to call the physician Importance of daily weights Low sodium diet Fluid restriction Medication management Anticipated future follow-up appointments   I attempted to educate Mr. Senat regarding his HF.  He initially thought that he was at "home" (ALF) - when I began speaking with him.  He also says that he does not weigh at the ALF regularly--"maybe every 2 weeks" and tells me that he has not weighed here either.  As he seems to be a bit confused today I will defer teaching at this time.  I may retry tomorrow if he seems clearer.  Education Materials:  "  Living Better With Heart Failure" Booklet, Daily Weight Tracker Tool.   High Risk Criteria for Readmission and/or Poor Patient Outcomes:   EF <30%- No  2 or more admissions in 6 months- No  Difficult social situation- No  Demonstrates medication noncompliance- No    Barriers of Care:  Knowledge and compliance--ability of facility to assist him with  compliance  Discharge Planning:   Plans to return to facility

## 2014-11-03 NOTE — Progress Notes (Signed)
Subjective:  No complaints. Has newspaper in arms.   Objective:  Vital Signs in the last 24 hours: Temp:  [98 F (36.7 C)-98.5 F (36.9 C)] 98 F (36.7 C) (05/09 0450) Pulse Rate:  [52-62] 62 (05/09 0450) Resp:  [18] 18 (05/09 0450) BP: (99-122)/(47-61) 99/52 mmHg (05/09 0450) SpO2:  [93 %-98 %] 93 % (05/09 0450) Weight:  [148 lb 4.1 oz (67.248 kg)] 148 lb 4.1 oz (67.248 kg) (05/09 0450)  Intake/Output from previous day: 05/08 0701 - 05/09 0700 In: 800 [P.O.:800] Out: 2550 [Urine:2550]   Physical Exam: General: Well developed, well nourished, in no acute distress. Head:  Normocephalic and atraumatic. Lungs: Clear to auscultation and percussion. Heart: Normal S1 and S2.  No murmur, rubs or gallops.  Abdomen: soft, non-tender, positive bowel sounds. Extremities: No clubbing or cyanosis. Much improved LE edema. No weeping Neurologic: Alert dementia noted.    Lab Results:  Recent Labs  11/01/14 1038 11/02/14 0416  WBC 6.0 6.1  HGB 12.4* 11.9*  PLT 226 219    Recent Labs  11/01/14 1038 11/02/14 0416  NA 136 140  K 4.2 4.2  CL 107 104  CO2 23 29  GLUCOSE 99 106*  BUN 46* 47*  CREATININE 1.66* 1.75*    Recent Labs  11/01/14 1038  TROPONINI 0.03   Hepatic Function Panel  Recent Labs  11/01/14 1038  PROT 6.1*  ALBUMIN 3.2*  AST 16  ALT 9*  ALKPHOS 69  BILITOT 0.6   No results for input(s): CHOL in the last 72 hours. No results for input(s): PROTIME in the last 72 hours.  Imaging: Dg Chest 2 View  11/01/2014   CLINICAL DATA:  Bilateral leg swelling for 1 month. Open wounds. History of colon cancer. Ex-smoker.  EXAM: CHEST  2 VIEW  COMPARISON:  05/17/2009.  FINDINGS: The cardiac silhouette remains near the upper limit of normal in size. Clear lungs with the exception of stable minimal linear scarring at the left lateral lung base. Moderate scoliosis is unchanged. Interval thoracolumbar vertebroplasty material.  IMPRESSION: No acute abnormality.    Electronically Signed   By: Claudie Revering M.D.   On: 11/01/2014 11:07   US Renal  11/01/2014   CLINICAL DATA:  Renal failure.  EXAM: RENAL / URINARY TRACT ULTRASOUND COMPLETE  COMPARISON:  CT 05/19/2009.  FINDINGS: Right Kidney:  Length: 10.7 cm. Renal cortical thinning. Upper pole cyst measuring 14 mm x 8 mm x 12 mm. No obstruction. No calculi.  Left Kidney:  Length: 10.4 cm. Renal cortical thinning compatible with medical renal disease. Lower pole cyst measuring 29 mm x 22 mm x 24 mm.  Bladder:  Bladder volume 762 mL. Bladder wall diverticula and cellules compatible with chronic bladder outflow obstruction. No bladder stones. By report, patient voided 600 mL prior to exam.  Small amount of ascites is incidentally noted suggesting volume overload.  IMPRESSION: 1. Renal cortical thinning and echogenicity compatible with medical renal disease. No obstruction. 2. Bladder trabeculation compatible with chronic bladder outflow obstruction. Postvoid bladder volume 762 mL, consistent with chronic obstruction.   Electronically Signed   By: Dereck Ligas M.D.   On: 11/01/2014 13:35   Personally viewed.   Telemetry: AFLUTTER 50-60's. Increases with activity Personally viewed.    Cardiac Studies:  ECHO EF normal.  Assessment/Plan:  Principal Problem:   Atrial flutter Active Problems:   Acute on chronic diastolic CHF (congestive heart failure), NYHA class 2   Essential hypertension   Renal failure  Dementia   CHF (congestive heart failure)   Acute renal failure syndrome   SOB (shortness of breath)   Esophageal reflux  -Change back to home lasix of 60mg  PO BID -Creat mildly elevated from yesterday (good diuresis) -Legs are much improved.  -No coumadin for flutter (was stopped previously after falls). Understands increased stroke risk.  -Would be OK with DC from cardiac perspective.  -Can follow up with cardiologist at [facility] as before. If necessary, I will be happy to see him.  -Check BMET in  one week.  -No pacemaker at this time.    Karletta Millay, [place] 11/03/2014, 9:12 AM

## 2014-11-03 NOTE — Care Management Note (Signed)
Case Management Note  Patient Details  Name: Kenneth Edwards MRN: [id number] Date of Birth: 1920/05/03  Subjective/Objective:        Pt admitted on 11/01/14 with CHF, atrial flutter.  PTA, pt resided at assisted living facility.             Action/Plan: PT recommending SNF level of care at dc.  CSW following to facilitate dc to SNF when medically stable.   Expected Discharge Date:                  Expected Discharge Plan:  Skilled Nursing Facility  In-House Referral:  Clinical Social Work  Discharge planning Services  CM Consult  Post Acute Care Choice:    Choice offered to:     DME Arranged:    DME Agency:     HH Arranged:    [place] Agency:     Status of Service:  In process, will continue to follow  Medicare Important Message Given:  N/A - LOS <3 / Initial given by admissions Date Medicare IM Given:    Medicare IM give by:    Date Additional Medicare IM Given:    Additional Medicare Important Message give by:     If discussed at [place] of Stay Meetings, dates discussed:    Additional Comments:  Ella Bodo, RN 11/03/2014, 1:58 PM Phone [phone]

## 2014-11-03 NOTE — Clinical Social Work Placement (Signed)
   CLINICAL SOCIAL WORK PLACEMENT  NOTE  Date:  11/03/2014  Patient Details  Name: Kenneth Edwards MRN: [id number] Date of Birth: 1920/01/20  Clinical Social Work is seeking post-discharge placement for this patient at the [place] level of care (*CSW will initial, date and re-position this form in  chart as items are completed):  Yes   Patient/family provided with [facility] Work Department's list of facilities offering this level of care within the geographic area requested by the patient (or if unable, by the patient's family).  Yes   Patient/family informed of their freedom to choose among providers that offer the needed level of care, that participate in Medicare, Medicaid or managed care program needed by the patient, have an available bed and are willing to accept the patient.  Yes   Patient/family informed of [facility]'s ownership interest in [facility] and [facility], as well as of the fact that they are under no obligation to receive care at these facilities.  PASRR submitted to EDS on 11/03/14     PASRR number received on 11/03/14     Existing PASRR number confirmed on       FL2 transmitted to all facilities in geographic area requested by pt/family on 11/03/14     FL2 transmitted to all facilities within larger geographic area on       Patient informed that his/her managed care company has contracts with or will negotiate with certain facilities, including the following:   (Has Medicare but no 3 day inpt qualifying hospital stay)     Yes   Patient/family informed of bed offers received.  Patient chooses bed at [facility] (Would choose [facility] if they had a private room. [facility] has private room. Pt's wife has been a resident there 2 times in the past)     Physician recommends and patient chooses bed at      Patient to be transferred to   on  .  Patient to be transferred to facility by       Patient family  notified on   of transfer.  Name of family member notified:  Craigory Toste- Son     PHYSICIAN Please prepare priority discharge summary, including medications, Please sign FL2, Please sign DNR     Additional Comment: 11/03/14  Per MD- patient is stable for d/c to SNF once bed located. MD did not feel that patient was safe to return to ALF.  Plan d/c 11/04/14 to SNF  Private room at either [facility] or [facility] (if they have a private room.).  Lorie Phenix. Murrell Redden [phone]      _______________________________________________ Williemae Area, LCSW 11/03/2014, 10:38 PM

## 2014-11-03 NOTE — Discharge Summary (Addendum)
Physician Discharge Summary  Kenneth Edwards VHQ:[id number] DOB: 15-May-1920 DOA: 11/01/2014  PCP: Dr. Elvina Mattes  Admit date: 11/01/2014 Discharge date: 11/04/2014  Time spent:  >30 Minutes  Recommendations for Outpatient Follow-up:  1. BMET to closely follow electrolytes and renal function 2. Will benefit of unnaboots assessment in outpatient setting 3. Reassess BP and adjust antihypertensive regimen as needed  Discharge Diagnoses:  Principal Problem:   Atrial flutter Active Problems:   Acute on chronic diastolic CHF (congestive heart failure), NYHA class 2   Essential hypertension   Renal failure   Dementia   CHF (congestive heart failure)   Acute renal failure syndrome   SOB (shortness of breath)   Esophageal reflux physical deconditioning   Discharge Condition: stable and improved. Will discharge to SNF for care and rehab; with instructions to follow with PCP in 1 week and also will need follow up with cardiology in outpatient setting  Diet recommendation: low sodium diet/heart healthy   Filed Weights   11/01/14 1300 11/02/14 0543 11/03/14 0450  Weight: 68.7 kg (151 lb 7.3 oz) 67.939 kg (149 lb 12.5 oz) 67.248 kg (148 lb 4.1 oz)    History of present illness:  81 y.o. male, with history of atrial flutter [place] VASC -3 who was taken off of Coumadin few years ago because of multiple falls, essential hypertension, dementia, history of colon cancer status post resection 30 years ago,he claims that it might have reoccurred but is not sure who told him that and BPH; whose wife died about a month ago and he now lives in assisted living facility primary care physician Dr. Elvina Mattes. He comes to the ER with chief complaints of massive swelling in both legs, mild SOB and some fatigue, in the ER workup consistent of atrial flutter with slow ventricular response, massive lower extremity edema, elevated proBNP with relatively stable chest x-ray and renal insufficiency unclear baseline.   Hospital  Course:  1-acute on chronic diastolic CHF: preserved EF (50-55%) -will advise to check daily weights -will discharge on lasix 60mg  BID -patient will need to follow low sodium diet  -patient renal function is slightly elevated, but overall stable by GFR assessment. -good diuresis during admission; a total of 4L -no B-blockers given bradycardia -no ACE due to soft BP and acute on chronic renal failure. Also EF 50-55% -troponin neg X 3   2-Atrial flutter: with slow ventricular response -cardiology has recommended not need for pacemaker and no anticoagulation; will follow rec's -Please avoid any agent that can slow down/block AV nodal -troponin neg X3 -Echo results demonstrating inability to assess wall motion abnormalities, preserved EF, no significant valvular disease and just mild LVH  3-Essential hypertension: soft, but stable -will hold lisinopril at discharge due to acute on chronic renal failure  -currently on lasix -BP soft; will reassess as an outpatient and adjust antihypertensive regimen as needed   4-BPH: continue flomax  5-Acute on chronic Renal failure: unclear stage as no data available. But base on GFR at least stage 3 at baseline -will discharge on lasix PO 60mg  BID -BMET in 1 week -advise to follow adequate hydration  6-Dementia with mild behavioral disturbances per history: stable and pleasant currently -continue supportive care -continue depakote and PRN ativan  7-GERD: will continue PPI  8-venous stasis dermatitis:  -stable and much improved with diuresis -patient will benefit of outpatient assessment for unnaboots   9-physical deconditioning: will discharge to SNF for  Care and rehabilitation  Procedures:  2-D echo   - Left ventricle: The  cavity size was normal. Wall thickness was increased in a pattern of mild LVH. Systolic function was normal. The estimated ejection fraction was in the range of 55% to 60%. Regional wall motion abnormalities  cannot be excluded. - Left atrium: The atrium was mildly dilated.  Consultations:  Cardiology   Discharge Exam: Filed Vitals:   11/03/14 0450  BP: 99/52  Pulse: 62  Temp: 98 F (36.7 C)  Resp: 18   General: Alert, awake, oriented x2, in no acute distress. Denies CP and reports breathing is back to baseline and his LE swelling are also better HEENT: No bruits, no goiter; mild JVD on exam Heart: bradycardia; no rubs or gallops; no murmurs. Per telemetry evaluation, patient on A. Flutter (2:1) Lungs: bibasilar crackles on exam, no wheezing. Positive scattered rhonchi  Abdomen: Soft, nontender, nondistended, positive bowel sounds.  Extremities: 2+ edema LE bilaterally; positive weeping changes and chronic stasis dermatitis  Neuro: Grossly intact, nonfocal.   Discharge Instructions   Discharge Instructions    Diet - low sodium heart healthy    Complete by:  As directed      Discharge instructions    Complete by:  As directed   Daily weights Follow low sodium diet (less than 2 gram daily) Take medications as prescribed     Increase activity slowly    Complete by:  As directed           Current Discharge Medication List    START taking these medications   Details  aspirin EC 81 MG EC tablet Take 1 tablet (81 mg total) by mouth daily.    pantoprazole (PROTONIX) 40 MG tablet Take 1 tablet (40 mg total) by mouth daily.      CONTINUE these medications which have CHANGED   Details  LORazepam (ATIVAN) 0.5 MG tablet Take 1 tablet (0.5 mg total) by mouth every 8 (eight) hours as needed for anxiety. Qty: 20 tablet, Refills: 0    traMADol (ULTRAM) 50 MG tablet Take 1 tablet (50 mg total) by mouth every 6 (six) hours as needed. Take 1-2 tablets by mouth every 8 hours for pain Qty: 30 tablet, Refills: 0      CONTINUE these medications which have NOT CHANGED   Details  acetaminophen (TYLENOL) 500 MG tablet Take 500 mg by mouth every 6 (six) hours as needed for mild pain.     cholecalciferol (VITAMIN D) 1000 UNITS tablet Take 1,000 Units by mouth daily.    divalproex (DEPAKOTE SPRINKLE) 125 MG capsule Take 125 mg by mouth daily.    furosemide (LASIX) 20 MG tablet Take 60 mg by mouth 2 (two) times daily.    tamsulosin (FLOMAX) 0.4 MG CAPS capsule Take 0.4 mg by mouth daily.     Vitamin D, Ergocalciferol, (DRISDOL) 50000 UNITS CAPS capsule Take 50,000 Units by mouth every 7 (seven) days.      STOP taking these medications     lisinopril (PRINIVIL,ZESTRIL) 20 MG tablet        No Known Allergies    The results of significant diagnostics from this hospitalization (including imaging, microbiology, ancillary and laboratory) are listed below for reference.    Significant Diagnostic Studies: Dg Chest 2 View  11/01/2014   CLINICAL DATA:  Bilateral leg swelling for 1 month. Open wounds. History of colon cancer. Ex-smoker.  EXAM: CHEST  2 VIEW  COMPARISON:  05/17/2009.  FINDINGS: The cardiac silhouette remains near the upper limit of normal in size. Clear lungs with the exception of stable  minimal linear scarring at the left lateral lung base. Moderate scoliosis is unchanged. Interval thoracolumbar vertebroplasty material.  IMPRESSION: No acute abnormality.   Electronically Signed   By: Claudie Revering M.D.   On: 11/01/2014 11:07   US Renal  11/01/2014   CLINICAL DATA:  Renal failure.  EXAM: RENAL / URINARY TRACT ULTRASOUND COMPLETE  COMPARISON:  CT 05/19/2009.  FINDINGS: Right Kidney:  Length: 10.7 cm. Renal cortical thinning. Upper pole cyst measuring 14 mm x 8 mm x 12 mm. No obstruction. No calculi.  Left Kidney:  Length: 10.4 cm. Renal cortical thinning compatible with medical renal disease. Lower pole cyst measuring 29 mm x 22 mm x 24 mm.  Bladder:  Bladder volume 762 mL. Bladder wall diverticula and cellules compatible with chronic bladder outflow obstruction. No bladder stones. By report, patient voided 600 mL prior to exam.  Small amount of ascites is incidentally  noted suggesting volume overload.  IMPRESSION: 1. Renal cortical thinning and echogenicity compatible with medical renal disease. No obstruction. 2. Bladder trabeculation compatible with chronic bladder outflow obstruction. Postvoid bladder volume 762 mL, consistent with chronic obstruction.   Electronically Signed   By: Dereck Ligas M.D.   On: 11/01/2014 13:35   Labs: Basic Metabolic Panel:  Recent Labs Lab 11/01/14 1038 11/02/14 0416 11/03/14 0918  NA 136 140 140  K 4.2 4.2 3.8  CL 107 104 101  CO2 23 29 31   GLUCOSE 99 106* 140*  BUN 46* 47* 50*  CREATININE 1.66* 1.75* 1.82*  CALCIUM 8.1* 8.4* 8.4*   Liver Function Tests:  Recent Labs Lab 11/01/14 1038  AST 16  ALT 9*  ALKPHOS 69  BILITOT 0.6  PROT 6.1*  ALBUMIN 3.2*   CBC:  Recent Labs Lab 11/01/14 1038 11/02/14 0416  WBC 6.0 6.1  NEUTROABS 3.4  --   HGB 12.4* 11.9*  HCT 37.9* 36.0*  MCV 98.7 96.0  PLT 226 219   Cardiac Enzymes:  Recent Labs Lab 11/01/14 1038  TROPONINI 0.03   BNP: BNP (last 3 results)  Recent Labs  11/01/14 1038  BNP 365.9*    Signed:  [name]  [facility] 11/03/2014, 10:47 AM

## 2014-11-03 NOTE — Consult Note (Signed)
WOC wound consult note Reason for Consult: bilateral LE weeping.  Pt appears to have venous stasis with hemosiderin staining and associated mild cellulitis on the LLE.  Currently he has several scabbed areas over the bilateral LE. One open area, partial thickness RLE medial.  No active weeping but he has had his legs elevated while inpatient.  Bilateral palpable pulses, 2-3+ pitting edema noted mostly in the malleolar area.  Wound type: venous stasis  Measurement: several scabbed areas, one noted ulcer right LE medial 3.5 cm x 0.7cm x 0.1cm  Wound bed:100% yellow Drainage (amount, consistency, odor) some serous drainage noted on the pad under the RLE.  Periwound: intact with hemosiderin staining bilaterally, some redness of the LLE. Not marked on the leg so I am not sure if this has improved.  Dressing procedure/placement/frequency: Silicone foam dressing for the one open ulcer on the RLE. May benefit from compression therapy, however with current erythema in the LLE would hold off until this resolves.   Discussed POC with patient and bedside nurse.  Re consult if needed, will not follow at this time. Thanks  [name], [place] [phone])

## 2014-11-03 NOTE — Progress Notes (Signed)
Per MD- patient is medically stable for d/c. Active bed search in place today for private pay bed as patient did not have a 3 day qualifying hospital stay for Medicare coverage.  Son- Melford Tullier aware and states that patient cannot be safely managed by [facility] and the family had already initiated interest in seeking a long term care SNF bed.  Bed search completed in [place] and surrounding counties- family hoped for placement in [facility] without resolution. Bed offer in place from [facility] for a private room.Awaiting possible bed offer from [facility].   Son lives in [place] and will be in [place] tomorrow morning to completed admission paperwork at facility.  He is aware of need to pay privately at facility.  Plan d/c to SNF tomorrow.  Discussed with Poonum Ambelal, LCSWA- covering CSW for tomorrow. Will also notified Dr. Dyann Kief of above.  Nursing aware. Fl2 on chart for MD's signature.   Lorie Phenix. Pauline Good, [place]

## 2014-11-03 NOTE — Evaluation (Signed)
Physical Therapy Evaluation Patient Details Name: Kenneth Edwards MRN: [id number] DOB: 28-Sep-1919 Today's Date: 11/03/2014   History of Present Illness  pt presents with HF, A-flutter, and hx of Dementia.  pt with weeping wounds on LEs.    Clinical Impression  Pt very fearful during mobility and multiple times stating, "I'm not sure what I'm going to do."  Noted pt from [place] and feel at this time pt will require SNF level of care at D/C.  Unsure if ALF is able to provide higher level of care and pt indicates he is to be moving to another facility soon, but is unsure of the name or type of facility.  Will continue to follow while on acute.      Follow Up Recommendations SNF    Equipment Recommendations  None recommended by PT    Recommendations for Other Services       Precautions / Restrictions Precautions Precautions: Fall Restrictions Weight Bearing Restrictions: No      Mobility  Bed Mobility Overal bed mobility: Needs Assistance Bed Mobility: Supine to Sit     Supine to sit: Mod assist;HOB elevated     General bed mobility comments: A with bringing hips to EOB and bringing trunk up to sitting.  pt moves very slowly and very labored with effort.    Transfers Overall transfer level: Needs assistance Equipment used: Rolling walker (2 wheeled) Transfers: Sit to/from [facility] Sit to Stand: Min assist Stand pivot transfers: Min assist       General transfer comment: cues for UE use and A for power up to standing.  pt unsteady and remains with very flexed posture.  pt indicates he is always dizzy upon coming to stand.    Ambulation/Gait                Stairs            Wheelchair Mobility    Modified Rankin (Stroke Patients Only)       Balance Overall balance assessment: Needs assistance;History of Falls Sitting-balance support: Single extremity supported;Feet supported Sitting balance-Leahy Scale: Poor   Postural  control: Posterior lean Standing balance support: Bilateral upper extremity supported;During functional activity Standing balance-Leahy Scale: Poor                               Pertinent Vitals/Pain Pain Assessment: Faces Faces Pain Scale: Hurts little more Pain Location: pt grimaces at times, but wen asked about pain states LEs feel funny more than painful.   Pain Descriptors / Indicators: Grimacing Pain Intervention(s): Monitored during session;Repositioned    Home Living Family/patient expects to be discharged to:: Skilled nursing facility                 Additional Comments: pt from ALF, but question if he will be able to return at this level of A.      Prior Function Level of Independence: Needs assistance   Gait / Transfers Assistance Needed: pt indicates he uses RW to ambulate short distance.    ADL's / Homemaking Assistance Needed: Unclear how much staff was assisting with ADLs.          Hand Dominance        Extremity/Trunk Assessment   Upper Extremity Assessment: Generalized weakness           Lower Extremity Assessment: Generalized weakness (Baseline edema)      Cervical / Trunk Assessment: Kyphotic  Communication   Communication: HOH  Cognition Arousal/Alertness: Awake/alert Behavior During Therapy: WFL for tasks assessed/performed Overall Cognitive Status: No family/caregiver present to determine baseline cognitive functioning                      General Comments      Exercises        Assessment/Plan    PT Assessment Patient needs continued PT services  PT Diagnosis Difficulty walking;Generalized weakness   PT Problem List Decreased strength;Decreased activity tolerance;Decreased balance;Decreased mobility;Decreased coordination;Decreased knowledge of use of DME  PT Treatment Interventions DME instruction;Gait training;Functional mobility training;Therapeutic activities;Therapeutic exercise;Balance  training;Patient/family education   PT Goals (Current goals can be found in the Care Plan section) Acute Rehab PT Goals Patient Stated Goal: Not to feel so bad.   PT Goal Formulation: With patient Time For Goal Achievement: 11/17/14 Potential to Achieve Goals: Fair    Frequency Min 2X/week   Barriers to discharge        Co-evaluation               End of Session Equipment Utilized During Treatment: Gait belt Activity Tolerance: Patient limited by fatigue Patient left: in chair;with call bell/phone within reach;with nursing/sitter in room Nurse Communication: Mobility status         Time: 3557-3220 PT Time Calculation (min) (ACUTE ONLY): 33 min   Charges:   PT Evaluation $Initial PT Evaluation Tier I: 1 Procedure PT Treatments $Therapeutic Activity: 8-22 mins   PT G CodesCatarina Hartshorn, [place] 11/03/2014, 12:19 PM

## 2014-11-03 NOTE — Clinical Social Work Note (Signed)
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  Yes (Patient feels safe but PT recommends SNF. Son agrees with this.) Need for family participation in patient care:  Yes (Comment)  Care giving concerns: Patient is very weak and unable to ambulate safely; he has severe ulcerations on both legs. He has been incontinent of bowel and bladder at times at the assisted living per son.    Social Worker assessment / plan:   81 year old male admitted from [place].  Physical Therapy has evaluated and recommend SNF care.  Patient's son indicated that he has not been managing well at [facility] for a while and he had begun a search for a long term care SNF bed for his father.  He prefers the [place] area as this is where most of his family is located. Kenneth Edwards- son is health care and financial power of attorney.  Per Dr. Dyann Kief- patient is medically stable today for d/c and he wants patient placed in a SNF unit- however- he has not had a qualifying 3 day inpatient hospital stay to allow for Medicare coverage. CSW  discussed option of return to [place] vs seeking a private pay bed at a SNF. Per son- the latter option was chosen as patient has the financial means for private pay at [facility].  He only wants a private room. Fl2 completed and placed on chart for MD's signature. Active bed search in place for private pay bed.  Employment status:  Retired Forensic scientist:  Medicare PT Recommendations:  [place] / Referral to community resources:  [place]  Patient/Family's Response to care:  Patient and son agree to SNF placement due to patient's deteriorating condition. He was not managing well at the Assisted Living.  Son states he has not been doing well since his wife's death 2 weeks ago.  He denies any grief issues at this time and feels he is coping "OK".  Patient/Family's Understanding of and Emotional Response to Diagnosis, Current Treatment, and Prognosis:  Patient seems to have a more limited understanding of his diagnosis, treatment plan and prognosis- however he is agreeable to seek SNF placement. He is a DNR and defers a great deal to his son to make health care decisions.  Patient's son Kenneth Edwards is very involved in his father's plan of care.  He stated that he knew that his father would need a higher level of care as he has deteriorated in the assisted living.    Emotional Assessment Appearance:  Appears stated age Attitude/Demeanor/Rapport:  Guarded (Was pleasant  with CSW but not forthcoming with information) Affect (typically observed):  Quiet, Calm Orientation:  Oriented to Self, Oriented to Place, Oriented to  Time, Oriented to Situation Alcohol / Substance use:  Never Used Psych involvement (Current and /or in the community):  No (Comment)  Discharge Needs  Concerns to be addressed:  Discharge Planning Concerns, Financial / Insurance Concerns (Increased level of care to SNF but no 3 day inpatient qualifying hospital stay.) Readmission  within the last 30 days:  No Current discharge risk:  None Barriers to Discharge:  Other (No  3 day qualifying inpatient hospital stay for SNF)   Kenneth Edwards 11/03/2014, 10:21 PM  [phone]

## 2014-11-04 ENCOUNTER — Encounter: Payer: Self-pay | Admitting: Internal Medicine

## 2014-11-04 ENCOUNTER — Non-Acute Institutional Stay (SKILLED_NURSING_FACILITY): Payer: Medicare Other | Admitting: Internal Medicine

## 2014-11-04 DIAGNOSIS — I8312 Varicose veins of left lower extremity with inflammation: Secondary | ICD-10-CM

## 2014-11-04 DIAGNOSIS — I5033 Acute on chronic diastolic (congestive) heart failure: Secondary | ICD-10-CM | POA: Diagnosis not present

## 2014-11-04 DIAGNOSIS — N400 Enlarged prostate without lower urinary tract symptoms: Secondary | ICD-10-CM

## 2014-11-04 DIAGNOSIS — I483 Typical atrial flutter: Secondary | ICD-10-CM | POA: Diagnosis not present

## 2014-11-04 DIAGNOSIS — N170 Acute kidney failure with tubular necrosis: Secondary | ICD-10-CM

## 2014-11-04 DIAGNOSIS — R001 Bradycardia, unspecified: Secondary | ICD-10-CM

## 2014-11-04 DIAGNOSIS — I509 Heart failure, unspecified: Secondary | ICD-10-CM

## 2014-11-04 DIAGNOSIS — F0391 Unspecified dementia with behavioral disturbance: Secondary | ICD-10-CM

## 2014-11-04 DIAGNOSIS — I872 Venous insufficiency (chronic) (peripheral): Secondary | ICD-10-CM

## 2014-11-04 DIAGNOSIS — K219 Gastro-esophageal reflux disease without esophagitis: Secondary | ICD-10-CM

## 2014-11-04 DIAGNOSIS — I110 Hypertensive heart disease with heart failure: Secondary | ICD-10-CM

## 2014-11-04 DIAGNOSIS — F03918 Unspecified dementia, unspecified severity, with other behavioral disturbance: Secondary | ICD-10-CM

## 2014-11-04 DIAGNOSIS — C189 Malignant neoplasm of colon, unspecified: Secondary | ICD-10-CM

## 2014-11-04 DIAGNOSIS — I8311 Varicose veins of right lower extremity with inflammation: Secondary | ICD-10-CM

## 2014-11-04 LAB — BASIC METABOLIC PANEL
Anion gap: 11 (ref 5–15)
BUN: 66 mg/dL — AB (ref 6–20)
CALCIUM: 8.3 mg/dL — AB (ref 8.9–10.3)
CO2: 28 mmol/L (ref 22–32)
CREATININE: 1.94 mg/dL — AB (ref 0.61–1.24)
Chloride: 101 mmol/L (ref 101–111)
GFR calc Af Amer: 33 mL/min — ABNORMAL LOW (ref >60)
GFR, EST NON AFRICAN AMERICAN: 28 mL/min — AB (ref >60)
GLUCOSE: 106 mg/dL — AB (ref 70–99)
Potassium: 4.2 mmol/L (ref 3.5–5.1)
Sodium: 140 mmol/L (ref 135–145)

## 2014-11-04 MED ORDER — FUROSEMIDE 20 MG PO TABS: 60.0000 mg | ORAL_TABLET | Freq: Two times a day (BID) | ORAL | Status: DC

## 2014-11-04 NOTE — Progress Notes (Signed)
Patient seen and examined. Has remained stable and is in no acute distress. After discussing with cardiology the only changes recommended is to restart lasix on 5/12 (given slight worsening of renal function with aggressive diuresis). No CP, no SOB and no need for pacemaker at this time. Patient has remained in stable/asymptomatic sinus bradycardia. Discharge summary updated to reflects this changes. Will discharge to SNF for further care and rehabilitation.   [name] [phone]

## 2014-11-04 NOTE — Assessment & Plan Note (Signed)
preserved EF (50-55%) -will advise to check daily weights -will discharge on lasix 60mg  BID -patient will need to follow low sodium diet  -patient renal function is slightly elevated, but overall stable by GFR assessment. -good diuresis during admission; a total of 4L -no B-blockers given bradycardia -no ACE due to soft BP and acute on chronic renal failure. Also EF 50-55% -troponin neg X 3

## 2014-11-04 NOTE — Assessment & Plan Note (Signed)
30 yrs prior

## 2014-11-04 NOTE — Progress Notes (Addendum)
CSW (Clinical Education officer, museum) prepared pt dc packet and placed with shadow chart. CSW arranged non-emergent ambulance transport for 11:30am as requested by pt son. Pt nurse and facility informed. CSW signing off.  ADDENDUM: CSW received call from pt nurse asking to come speak with pt son. Pt son with numerous questions and concerns about how pt discharge was handled and medicare non-coverage of SNF. CSW explained that Medicare guidelines dictate these policies not [place]. Pt son understanding and wanting information on how to appeal SNF non-coverage. CSW directed him to [facility] website. Pt son agreeable to dc today to [facility] and confirmed ability to pay privately. Pt son with questions about hospital bill. CSW asked RNCM to speak with him. Unit Director aware of situation and has spoken with pt son. MD also paged at pt son request. No further questions for CSW from pt son. CSW signing off.   [place], [place]

## 2014-11-04 NOTE — Assessment & Plan Note (Signed)
per history: stable and pleasant currently -continue supportive care -continue depakote and PRN ativan

## 2014-11-04 NOTE — Progress Notes (Signed)
    Subjective:  No complaints. Ate entire breakfast   Objective:  Vital Signs in the last 24 hours: Temp:  [97.8 F (36.6 C)-98.8 F (37.1 C)] 98.6 F (37 C) (05/10 0517) Pulse Rate:  [57-66] 57 (05/10 0517) Resp:  [18-19] 18 (05/10 0517) BP: (99-103)/(47-55) 103/55 mmHg (05/10 0517) SpO2:  [94 %-95 %] 94 % (05/10 0517) Weight:  [144 lb 14.4 oz (65.726 kg)] 144 lb 14.4 oz (65.726 kg) (05/10 0517)  Intake/Output from previous day: 05/09 0701 - 05/10 0700 In: 1200 [P.O.:1200] Out: 1650 [Urine:1650]   Physical Exam: General: Well developed, well nourished, in no acute distress. Head:  Normocephalic and atraumatic. Lungs: Clear to auscultation and percussion. Heart: Normal S1 and S2.  No murmur, rubs or gallops.  Abdomen: soft, non-tender, positive bowel sounds. Extremities: No clubbing or cyanosis. Much improved LE edema. No weeping Neurologic: Alert dementia noted.    Lab Results:  Recent Labs  11/01/14 1038 11/02/14 0416  WBC 6.0 6.1  HGB 12.4* 11.9*  PLT 226 219    Recent Labs  11/03/14 0918 11/04/14 0409  NA 140 140  K 3.8 4.2  CL 101 101  CO2 31 28  GLUCOSE 140* 106*  BUN 50* 66*  CREATININE 1.82* 1.94*    Recent Labs  11/01/14 1038  TROPONINI 0.03   Hepatic Function Panel  Recent Labs  11/01/14 1038  PROT 6.1*  ALBUMIN 3.2*  AST 16  ALT 9*  ALKPHOS 69  BILITOT 0.6   No results for input(s): CHOL in the last 72 hours. No results for input(s): PROTIME in the last 72 hours.    Telemetry: AFLUTTER 50-60's. Increases with activity Personally viewed.    Cardiac Studies:  ECHO EF normal.  Assessment/Plan:  Principal Problem:   Atrial flutter Active Problems:   Acute on chronic diastolic CHF (congestive heart failure), NYHA class 2   Essential hypertension   Renal failure   Dementia   CHF (congestive heart failure)   Acute renal failure syndrome   SOB (shortness of breath)   Esophageal reflux  -Change back to home lasix of  60mg  PO QD after holding for 2 days if possible. Creat increased secondary to diuresis.  -Creat mildly elevated from yesterday (good diuresis) -Legs are much improved.  -No coumadin for flutter (was stopped previously after falls). Understands increased stroke risk.  -Would be OK with DC from cardiac perspective.  -Can follow up with cardiologist at [facility] as before. If necessary, I will be happy to see him.  -Check BMET in one week.  -No pacemaker at this time.    SKAINS, [place] 11/04/2014, 8:01 AM

## 2014-11-04 NOTE — Clinical Social Work Placement (Signed)
   CLINICAL SOCIAL WORK PLACEMENT  NOTE  Date:  11/04/2014  Patient Details  Name: Kenneth Edwards MRN: [id number] Date of Birth: Jun 16, 1920  Clinical Social Work is seeking post-discharge placement for this patient at the [place] level of care (*CSW will initial, date and re-position this form in  chart as items are completed):  Yes   Patient/family provided with [place] Work Department's list of facilities offering this level of care within the geographic area requested by the patient (or if unable, by the patient's family).  Yes   Patient/family informed of their freedom to choose among providers that offer the needed level of care, that participate in Medicare, Medicaid or managed care program needed by the patient, have an available bed and are willing to accept the patient.  Yes   Patient/family informed of [facility]'s ownership interest in [facility] and [facility], as well as of the fact that they are under no obligation to receive care at these facilities.  PASRR submitted to EDS on 11/03/14     PASRR number received on 11/03/14     Existing PASRR number confirmed on       FL2 transmitted to all facilities in geographic area requested by pt/family on 11/03/14     FL2 transmitted to all facilities within larger geographic area on       Patient informed that his/her managed care company has contracts with or will negotiate with certain facilities, including the following:   (Has Medicare but no 3 day inpt qualifying hospital stay)     Yes   Patient/family informed of bed offers received.  Patient chooses bed at [facility]     Physician recommends and patient chooses bed at      Patient to be transferred to [facility] on 11/04/14.  Patient to be transferred to facility by [facility]     Patient family notified on 11/04/14 of transfer.  Name of family member notified:  Hever Castilleja- Son      PHYSICIAN Please prepare priority discharge summary, including medications, Please sign FL2, Please sign DNR     Additional Comment:    _______________________________________________ Berton Mount, [place]

## 2014-11-04 NOTE — Progress Notes (Signed)
Pt is being discharged to [facility]. EMS is here to pick up patient. Report called to [facility]. I spoke with Ad[place]. SBAR used to give report. Call back number provided.

## 2014-11-04 NOTE — Assessment & Plan Note (Signed)
:   unclear stage as no data available. But base on GFR at least stage 3 at baseline -will discharge on lasix PO 60mg  BID -BMET in 1 week -advise to follow adequate hydration

## 2014-11-04 NOTE — Assessment & Plan Note (Signed)
-  stable and much improved with diuresis -patient will benefit of outpatient assessment for unnaboots

## 2014-11-04 NOTE — Assessment & Plan Note (Signed)
Continue flomax  

## 2014-11-04 NOTE — Assessment & Plan Note (Signed)
:   with slow ventricular response -cardiology has recommended not need for pacemaker and no anticoagulation; will follow rec's -Please avoid any agent that can slow down/block AV nodal -troponin neg X3 -Echo results demonstrating inability to assess wall motion abnormalities, preserved EF, no significant valvular disease and just mild LVH

## 2014-11-04 NOTE — Assessment & Plan Note (Signed)
Continue protonix 40 mg 

## 2014-11-04 NOTE — Progress Notes (Signed)
MRN: [id number] Name: Kenneth Edwards  Sex: male Age: 81 y.o. DOB: [birth year]  [place] #: [facility] Facility/Room:405 Level Of Care: SNF Provider: Inocencio Homes D Emergency Contacts: Extended Emergency Contact Information Primary Emergency Contact: Slabaugh,Evelyn Address: [address]          [place], [place] [address] [place]itter of [place] Phone: [phone] Relation: Spouse Secondary Emergency Contact: Feroe,Carol Address: [address]          [place], [place] [address] Johnnette Litter of [place] Phone: [phone] Relation: Daughter  Code Status: DNR  Allergies: Review of patient's allergies indicates no known allergies.  Chief Complaint  Patient presents with  . New Admit To SNF    HPI: Patient is 81 y.o. male who is admitted to SNF for generalized weakness after being hospitalized for acute on chronic CHF and atrial flutter.  Past Medical History  Diagnosis Date  . Essential hypertension   . Colon cancer   . Atrial flutter   . Dementia   . BPH (benign prostatic hyperplasia)     Past Surgical History  Procedure Laterality Date  . Colon surgery        Medication List       This list is accurate as of: 11/04/14 11:59 PM.  Always use your most recent med list.               acetaminophen 500 MG tablet  Commonly known as:  TYLENOL  Take 500 mg by mouth every 6 (six) hours as needed for mild pain.     aspirin 81 MG EC tablet  Take 1 tablet (81 mg total) by mouth daily.     cholecalciferol 1000 UNITS tablet  Commonly known as:  VITAMIN D  Take 1,000 Units by mouth daily.     divalproex 125 MG capsule  Commonly known as:  DEPAKOTE SPRINKLE  Take 125 mg by mouth daily.     furosemide 20 MG tablet  Commonly known as:  LASIX  Take 3 tablets (60 mg total) by mouth 2 (two) times daily.     LORazepam 0.5 MG tablet  Commonly known as:  ATIVAN  Take 1 tablet (0.5 mg total) by mouth every 8 (eight) hours as needed for anxiety.     pantoprazole 40 MG  tablet  Commonly known as:  PROTONIX  Take 1 tablet (40 mg total) by mouth daily.     tamsulosin 0.4 MG Caps capsule  Commonly known as:  FLOMAX  Take 0.4 mg by mouth daily.     traMADol 50 MG tablet  Commonly known as:  ULTRAM  Take 1 tablet (50 mg total) by mouth every 6 (six) hours as needed. Take 1-2 tablets by mouth every 8 hours for pain     Vitamin D (Ergocalciferol) 50000 UNITS Caps capsule  Commonly known as:  DRISDOL  Take 50,000 Units by mouth every 7 (seven) days.        No orders of the defined types were placed in this encounter.     There is no immunization history on file for this patient.  History  Substance Use Topics  . Smoking status: Never Smoker   . Smokeless tobacco: Not on file  . Alcohol Use: No    Family history is noncontributory    Review of Systems  DATA OBTAINED: from patient, nurse GENERAL:  no fevers, fatigue, appetite changes SKIN: No itching, rash; stage 2 sacral decub EYES: No eye pain, redness, discharge EARS: No earache, tinnitus,  change in hearing NOSE: No congestion, drainage or bleeding  MOUTH/THROAT: No mouth or tooth pain, No sore throat RESPIRATORY: No cough, wheezing, SOB CARDIAC: No chest pain, palpitations, +lower extremity edema  GI: No abdominal pain, No N/V/D or constipation, No heartburn or reflux  GU: No dysuria, frequency or urgency, or incontinence  MUSCULOSKELETAL: No unrelieved bone/joint pain NEUROLOGIC: No headache, dizziness or focal weakness PSYCHIATRIC: No overt anxiety or sadness, No behavior issue.   Filed Vitals:   11/04/14 2019  BP: 126/68  Pulse: 69  Temp: 98 F (36.7 C)  Resp: 18    Physical Exam  GENERAL APPEARANCE: Alert, conversant,  No acute distress.  SKIN: No diaphoresis rash HEAD: Normocephalic, atraumatic  EYES: Conjunctiva/lids clear. Pupils round, reactive. EOMs intact.  EARS: External exam WNL, canals clear. Hearing grossly normal.  NOSE: No deformity or discharge.   MOUTH/THROAT: Lips w/o lesions  RESPIRATORY: Breathing is even, unlabored. Lung sounds are decreased   CARDIOVASCULAR: Heart RRR 3/6 systolic murmur, no rubs or gallops. trace peripheral edema.   GASTROINTESTINAL: Abdomen is soft, non-tender, not distended w/ normal bowel sounds. GENITOURINARY: Bladder non tender, not distended  MUSCULOSKELETAL: No abnormal joints or musculature NEUROLOGIC:  Cranial nerves 2-12 grossly intact  PSYCHIATRIC: Mood and affect appropriate to situation, no behavioral issues  Patient Active Problem List   Diagnosis Date Noted  . Venous stasis dermatitis of both lower extremities 11/04/2014  . Acute renal failure syndrome   . SOB (shortness of breath)   . Esophageal reflux   . Atrial flutter 11/01/2014  . Acute on chronic diastolic CHF (congestive heart failure), NYHA class 2 11/01/2014  . Hypertensive heart disease with CHF (congestive heart failure) 11/01/2014  . Renal failure 11/01/2014  . Dementia with behavioral disturbance 11/01/2014  . CHF (congestive heart failure) 11/01/2014  . Atrial flutter, unspecified   . BPH (benign prostatic hyperplasia)   . Colon cancer     CBC    Component Value Date/Time   WBC 6.1 11/02/2014 0416   RBC 3.75* 11/02/2014 0416   HGB 11.9* 11/02/2014 0416   HCT 36.0* 11/02/2014 0416   PLT 219 11/02/2014 0416   MCV 96.0 11/02/2014 0416   LYMPHSABS 1.5 11/01/2014 1038   MONOABS 0.9 11/01/2014 1038   EOSABS 0.2 11/01/2014 1038   BASOSABS 0.0 11/01/2014 1038    CMP     Component Value Date/Time   NA 140 11/04/2014 0409   K 4.2 11/04/2014 0409   CL 101 11/04/2014 0409   CO2 28 11/04/2014 0409   GLUCOSE 106* 11/04/2014 0409   BUN 66* 11/04/2014 0409   CREATININE 1.94* 11/04/2014 0409   CALCIUM 8.3* 11/04/2014 0409   PROT 6.1* 11/01/2014 1038   ALBUMIN 3.2* 11/01/2014 1038   AST 16 11/01/2014 1038   ALT 9* 11/01/2014 1038   ALKPHOS 69 11/01/2014 1038   BILITOT 0.6 11/01/2014 1038   GFRNONAA 28* 11/04/2014  0409   GFRAA 33* 11/04/2014 0409    Assessment and Plan  Acute on chronic diastolic CHF (congestive heart failure), NYHA class 2 preserved EF (50-55%) -will advise to check daily weights -will discharge on lasix 60mg  BID -patient will need to follow low sodium diet  -patient renal function is slightly elevated, but overall stable by GFR assessment. -good diuresis during admission; a total of 4L -no B-blockers given bradycardia -no ACE due to soft BP and acute on chronic renal failure. Also EF 50-55% -troponin neg X 3    Atrial flutter : with slow ventricular response -  cardiology has recommended not need for pacemaker and no anticoagulation; will follow rec's -Please avoid any agent that can slow down/block AV nodal -troponin neg X3 -Echo results demonstrating inability to assess wall motion abnormalities, preserved EF, no significant valvular disease and just mild LVH    Hypertensive heart disease with CHF (congestive heart failure) soft, but stable -will hold lisinopril at discharge due to acute on chronic renal failure  -currently on lasix -BP soft; will reassess as an outpatient and adjust antihypertensive regimen as needed    Acute renal failure syndrome : unclear stage as no data available. But base on GFR at least stage 3 at baseline -will discharge on lasix PO 60mg  BID -BMET in 1 week -advise to follow adequate hydration   BPH (benign prostatic hyperplasia) Continue flomax   Dementia with behavioral disturbance per history: stable and pleasant currently -continue supportive care -continue depakote and PRN ativan   Venous stasis dermatitis of both lower extremities  -stable and much improved with diuresis -patient will benefit of outpatient assessment for unnaboots    Esophageal reflux Continue protonix 40 mg   Colon cancer 30 yrs prior     Hennie Duos, MD

## 2014-11-04 NOTE — Assessment & Plan Note (Signed)
soft, but stable -will hold lisinopril at discharge due to acute on chronic renal failure  -currently on lasix -BP soft; will reassess as an outpatient and adjust antihypertensive regimen as needed

## 2014-11-08 ENCOUNTER — Encounter: Payer: Self-pay | Admitting: Internal Medicine

## 2014-11-14 ENCOUNTER — Ambulatory Visit: Payer: Medicare Other | Admitting: Cardiology

## 2014-12-10 ENCOUNTER — Encounter: Payer: Self-pay | Admitting: Cardiology

## 2014-12-10 ENCOUNTER — Ambulatory Visit (INDEPENDENT_AMBULATORY_CARE_PROVIDER_SITE_OTHER): Payer: Medicare Other | Admitting: Cardiology

## 2014-12-10 VITALS — BP 124/50 | HR 59 | Ht 70.0 in

## 2014-12-10 DIAGNOSIS — R600 Localized edema: Secondary | ICD-10-CM | POA: Diagnosis not present

## 2014-12-10 DIAGNOSIS — R001 Bradycardia, unspecified: Secondary | ICD-10-CM

## 2014-12-10 DIAGNOSIS — I4819 Other persistent atrial fibrillation: Secondary | ICD-10-CM

## 2014-12-10 DIAGNOSIS — F0390 Unspecified dementia without behavioral disturbance: Secondary | ICD-10-CM | POA: Diagnosis not present

## 2014-12-10 DIAGNOSIS — I481 Persistent atrial fibrillation: Secondary | ICD-10-CM | POA: Diagnosis not present

## 2014-12-10 DIAGNOSIS — I4892 Unspecified atrial flutter: Secondary | ICD-10-CM | POA: Diagnosis not present

## 2014-12-10 DIAGNOSIS — I5032 Chronic diastolic (congestive) heart failure: Secondary | ICD-10-CM

## 2014-12-10 NOTE — Progress Notes (Signed)
Cardiology Office Note   Date:  12/10/2014   ID:  Kenneth Edwards, DOB 11/25/1919, MRN [id number]  PCP:  Hennie Duos, MD  Cardiologist:   Candee Furbish, MD       History of Present Illness: Kenneth Edwards is a 81 y.o. male who presents for hospital follow-up, atrial flutter, multiple falls. Has underlying dementia, was taken off of Coumadin a few years ago because of multiple falls. Had a remote history of colon cancer resected over 30 years ago. Had lower extremity swelling as well as fatigue and atrial fibrillation with slow ventricular response, heart rate of 47 bpm. Heart rate was monitored on telemetry, mostly in the low 50s. I did not think that pacemaker placement was absolutely necessary given his moderate bradycardia. Discussed with his daughter at the time. No syncope. Main issue seemed to be massive lower extremity edema.  Since leaving the hospital, he is currently residing in [facility] rehabilitation. Doing much better from an edema standpoint. No shortness of breath, no syncope. Maintaining maintenance Lasix.    Past Medical History  Diagnosis Date  . Essential hypertension   . Colon cancer   . Atrial flutter   . Dementia   . BPH (benign prostatic hyperplasia)     Past Surgical History  Procedure Laterality Date  . Colon surgery       Current Outpatient Prescriptions  Medication Sig Dispense Refill  . acetaminophen (TYLENOL) 500 MG tablet Take 500 mg by mouth every 6 (six) hours as needed for mild pain.    Marland Kitchen aspirin EC 81 MG EC tablet Take 1 tablet (81 mg total) by mouth daily.    . cholecalciferol (VITAMIN D) 1000 UNITS tablet Take 1,000 Units by mouth daily.    . divalproex (DEPAKOTE SPRINKLE) 125 MG capsule Take 125 mg by mouth daily.    . furosemide (LASIX) 20 MG tablet Take 3 tablets (60 mg total) by mouth 2 (two) times daily.    Marland Kitchen LORazepam (ATIVAN) 0.5 MG tablet Take 1 tablet (0.5 mg total) by mouth every 8 (eight) hours as needed for anxiety. 20  tablet 0  . pantoprazole (PROTONIX) 40 MG tablet Take 1 tablet (40 mg total) by mouth daily.    . tamsulosin (FLOMAX) 0.4 MG CAPS capsule Take 0.4 mg by mouth daily.     . traMADol (ULTRAM) 50 MG tablet Take 1 tablet (50 mg total) by mouth every 6 (six) hours as needed. Take 1-2 tablets by mouth every 8 hours for pain 30 tablet 0  . Vitamin D, Ergocalciferol, (DRISDOL) 50000 UNITS CAPS capsule Take 50,000 Units by mouth every 7 (seven) days.     No current facility-administered medications for this visit.    Allergies:   Review of patient's allergies indicates no known allergies.    Social History:  The patient  reports that he has never smoked. He does not have any smokeless tobacco history on file. He reports that he does not drink alcohol or use illicit drugs.   Family History:  The patient's Family history is unknown by patient.    ROS:  Please see the history of present illness.   Otherwise, review of systems are positive for none.   All other systems are reviewed and negative.    PHYSICAL EXAM: VS:  BP 124/50 mmHg  Pulse 59  Ht 5\' 10"  (1.778 m) , BMI There is no weight on file to calculate BMI. GEN: Well nourished, well developed, in no acute distressSitting in wheelchair.  HEENT: normalHard of hearing Neck: no JVD, carotid bruits, or masses Cardiac: Irregular with occasional ectopy; no murmurs, rubs, or gallops,no edema  Respiratory:  clear to auscultation bilaterally, normal work of breathing GI: soft, nontender, nondistended, + BS MS: no deformity or atrophy Skin: warm and dry, no rash, edema of lower extremities has significantly improved, trace to 1+. Neuro:  Strength and sensation are intact Psych: euthymic mood, full affect, baseline dementia.   EKG:  EKG is not ordered today.    Recent Labs: 11/01/2014: ALT 9*; B Natriuretic Peptide 365.9*; TSH 0.951 11/02/2014: Hemoglobin 11.9*; Platelets 219 11/04/2014: BUN 66*; Creatinine, Ser 1.94*; Potassium 4.2; Sodium 140     Lipid Panel No results found for: CHOL, TRIG, HDL, CHOLHDL, VLDL, LDLCALC, LDLDIRECT    Wt Readings from Last 3 Encounters:  11/04/14 144 lb 14.4 oz (65.726 kg)    Echocardiogram: 11/02/14 - Left ventricle: The cavity size was normal. Wall thickness was increased in a pattern of mild LVH. Systolic function was normal. The estimated ejection fraction was in the range of 55% to 60%. Regional wall motion abnormalities cannot be excluded. - Left atrium: The atrium was mildly dilated.  Other studies Reviewed: Additional studies/ records that were reviewed today include: Hospital records reviewed, lab work reviewed, echocardiogram, EKG. Review of the above records demonstrates: As above   ASSESSMENT AND PLAN:  1.  Bradycardia -no indication for pacemaker at this time. No high-risk symptoms such as syncope.   2. Dementia-per primary team.  3. Atrial fibrillation with slow ventricular response-avoid AV nodal blocking agents. CHADS-VASc at least 3. Not a Coumadin candidate. Previously taken off.  4. Lower extremity edema - much improved edema. Lasix seems to be doing its job. Continue to monitor his basic medical profile.  5. Chronic kidney disease-creatinine worsened during hospitalization from 1.75 up to 1.94 with BUN increasing from 47-66 with diuresis. Maintenance Lasix as dosed above. Continue. This is been doing a good job on his lower extremity edema and chronic diastolic heart failure.  6. Chronic diastolic heart failure-currently well controlled. Breathing well. No complaints. Edema improved.  Current medicines are reviewed at length with the patient today.  The patient does not have concerns regarding medicines.  The following changes have been made:  no change  Labs/ tests ordered today include:  No orders of the defined types were placed in this encounter.     Disposition:   3 month follow-up to monitor heart failure symptoms, bradycardia,  edema  Signed, Candee Furbish, MD  12/10/2014 10:24 AM    [facility] [place], [place], [place]  [address] Phone: [phone]; Fax: [phone]

## 2014-12-10 NOTE — Patient Instructions (Signed)
Medication Instructions:  Your physician recommends that you continue on your current medications as directed. Please refer to the Current Medication list given to you today.  Follow-Up: Follow up in 3 months with Richardson Dopp, PA.  Thank you for choosing [facility]!!

## 2015-01-13 ENCOUNTER — Encounter: Payer: Self-pay | Admitting: Vascular Surgery

## 2015-01-16 ENCOUNTER — Encounter: Payer: Medicare Other | Admitting: Vascular Surgery

## 2015-01-19 ENCOUNTER — Encounter: Payer: Self-pay | Admitting: Vascular Surgery

## 2015-01-20 ENCOUNTER — Ambulatory Visit (INDEPENDENT_AMBULATORY_CARE_PROVIDER_SITE_OTHER): Payer: Medicare Other | Admitting: Vascular Surgery

## 2015-01-20 ENCOUNTER — Encounter: Payer: Self-pay | Admitting: Vascular Surgery

## 2015-01-20 VITALS — BP 121/65 | HR 67 | Temp 97.9°F | Resp 16 | Ht 70.0 in | Wt 140.0 lb

## 2015-01-20 DIAGNOSIS — M79675 Pain in left toe(s): Secondary | ICD-10-CM

## 2015-01-20 DIAGNOSIS — M79674 Pain in right toe(s): Secondary | ICD-10-CM | POA: Diagnosis not present

## 2015-01-20 DIAGNOSIS — R2243 Localized swelling, mass and lump, lower limb, bilateral: Secondary | ICD-10-CM | POA: Diagnosis not present

## 2015-01-20 NOTE — Progress Notes (Signed)
Subjective:     Patient ID: Kenneth Edwards, male   DOB: 1920-01-12, 81 y.o.   MRN: [id number]  HPI this 81 year old male is a resident of [facility] facility and was referred by Dr. Sheppard Coil for nonhealing pressures sores on both heels. This patient apparently has been at this facility for tears. He is nonambulatory. He is accompanied by his sister and brother-in-law. He apparently has not ambulated since he was at the last facility he resided in. The ulcers have been present for may months or at least one year. Apparently not going to the wound Center at this time. He had a venous ultrasound revealing no DVT. He also had recent ABIs performed which were normal.   Past Medical History  Diagnosis Date  . Essential hypertension   . Colon cancer   . Atrial flutter   . Dementia   . BPH (benign prostatic hyperplasia)     History  Substance Use Topics  . Smoking status: Never Smoker   . Smokeless tobacco: Never Used  . Alcohol Use: No    Family History  Problem Relation Age of Onset  . Family history unknown: Yes    No Known Allergies   Current outpatient prescriptions:  .  acetaminophen (TYLENOL) 500 MG tablet, Take 500 mg by mouth every 6 (six) hours as needed for mild pain., Disp: , Rfl:  .  aspirin EC 81 MG EC tablet, Take 1 tablet (81 mg total) by mouth daily., Disp: , Rfl:  .  cholecalciferol (VITAMIN D) 1000 UNITS tablet, Take 1,000 Units by mouth daily., Disp: , Rfl:  .  escitalopram (LEXAPRO) 10 MG tablet, Take 10 mg by mouth daily., Disp: , Rfl:  .  furosemide (LASIX) 20 MG tablet, Take 3 tablets (60 mg total) by mouth 2 (two) times daily., Disp: , Rfl:  .  Multiple Vitamins-Minerals (MULTIVITAMIN WITH MINERALS) tablet, Take 1 tablet by mouth daily., Disp: , Rfl:  .  pantoprazole (PROTONIX) 40 MG tablet, Take 1 tablet (40 mg total) by mouth daily., Disp: , Rfl:  .  tamsulosin (FLOMAX) 0.4 MG CAPS capsule, Take 0.4 mg by mouth daily. , Disp: , Rfl:  .  traMADol (ULTRAM)  50 MG tablet, Take 1 tablet (50 mg total) by mouth every 6 (six) hours as needed. Take 1-2 tablets by mouth every 8 hours for pain, Disp: 30 tablet, Rfl: 0 .  Vitamin D, Ergocalciferol, (DRISDOL) 50000 UNITS CAPS capsule, Take 50,000 Units by mouth every 7 (seven) days., Disp: , Rfl:  .  divalproex (DEPAKOTE SPRINKLE) 125 MG capsule, Take 125 mg by mouth daily., Disp: , Rfl:  .  LORazepam (ATIVAN) 0.5 MG tablet, Take 1 tablet (0.5 mg total) by mouth every 8 (eight) hours as needed for anxiety. (Patient not taking: Reported on 01/20/2015), Disp: 20 tablet, Rfl: 0 .  Nutritional Supplements (JUVEN REVIGOR) PACK, Take 1 packet by mouth 2 (two) times daily., Disp: , Rfl:   Filed Vitals:   01/20/15 1501  BP: 121/65  Pulse: 67  Temp: 97.9 F (36.6 C)  TempSrc: Oral  Resp: 16  Height: 5\' 10"  (1.778 m)  Weight: 140 lb (63.504 kg)  SpO2: 99%    Body mass index is 20.09 kg/(m^2).           Review of Systems Limited review of systems obtained from patient who does have dementia     Objective:   Physical Exam BP 121/65 mmHg  Pulse 67  Temp(Src) 97.9 F (36.6 C) (Oral)  Resp 16  Ht 5\' 10"  (1.778 m)  Wt 140 lb (63.504 kg)  BMI 20.09 kg/m2  SpO2 99%   elderly male in a wheelchair -appears quite frail  Lungs no rhonchi or wheezing Bilateral lower extremities with no palpable pulses in feet. He has symmetric pressure sores on the heels each measuring about 3-3-1/2 cm in diameter. No ulceration or ischemia on the anterior aspect of the feet.   I independently performed a Doppler exam and  He has excellent brisk  Biphasic flow in both feet. Previous ABIs 1.32 on the right 1.31 on the left. This was from a radiology report from him [facility] mobile x-ray lab     Assessment:      bilateral heel pressure sores in patient with relatively normal arterial circulation     Plan:      nothing to add from a vascular standpoint Patient needs pressure off of heels May need to be seen in wound  center for recommendations  no further vascular workup indicated

## 2015-02-02 ENCOUNTER — Other Ambulatory Visit: Payer: Self-pay | Admitting: Internal Medicine

## 2015-02-02 DIAGNOSIS — D381 Neoplasm of uncertain behavior of trachea, bronchus and lung: Secondary | ICD-10-CM

## 2015-02-03 ENCOUNTER — Non-Acute Institutional Stay (SKILLED_NURSING_FACILITY): Payer: Medicare Other | Admitting: Internal Medicine

## 2015-02-03 ENCOUNTER — Encounter: Payer: Self-pay | Admitting: Internal Medicine

## 2015-02-03 DIAGNOSIS — F0391 Unspecified dementia with behavioral disturbance: Secondary | ICD-10-CM | POA: Diagnosis not present

## 2015-02-03 DIAGNOSIS — R600 Localized edema: Secondary | ICD-10-CM | POA: Diagnosis not present

## 2015-02-03 DIAGNOSIS — I4819 Other persistent atrial fibrillation: Secondary | ICD-10-CM

## 2015-02-03 DIAGNOSIS — I481 Persistent atrial fibrillation: Secondary | ICD-10-CM

## 2015-02-03 DIAGNOSIS — F03918 Unspecified dementia, unspecified severity, with other behavioral disturbance: Secondary | ICD-10-CM

## 2015-02-03 DIAGNOSIS — I5033 Acute on chronic diastolic (congestive) heart failure: Secondary | ICD-10-CM | POA: Diagnosis not present

## 2015-02-03 NOTE — Progress Notes (Signed)
Patient ID: Kenneth Edwards, male   DOB: 02/23/1920, 81 y.o.   MRN: [id number] MRN: [id number] Name: Kenneth Edwards  Sex: male Age: 81 y.o. DOB: 08-20-1919  [place] #: [facility] Facility/Room:405 Level Of Care: SNF Provider: Wille Celeste Emergency Contacts: Extended Emergency Contact Information Primary Emergency Contact: [facility] Address: [address] [place], [place] [address] Johnnette Litter of [place] Phone: [phone] Relation: Daughter Secondary Emergency Contact: Daiva Huge [place] of [place] Phone: [phone] Relation: Son  Code Status: DNR  Allergies: Review of patient's allergies indicates no known allergies.  Chief Complaint  Patient presents with  . Medical Management of Chronic Issues    HPI: Patient is 81 y.o. male who is admitted to SNF for generalized weakness after being hospitalized for acute on chronic CHF and atrial flutter. His stay here has been relatively unremarkable however nursing staff has noticed some weight gain appears he is gained about 10 pounds over the past week-current weight is 159.8.  He does not complain of any increased shortness of breath or cough-he did have a chest x-ray which showed no acute pulmonary abnormality scarring versus an early neoplastic lesion suggestive for a follow-up CT scan which is pending.  In the hospital he was found to have diastolic CHF he is on Lasix 60 mg twice a day-he was not put on an ACE inhibitor secondary to soft blood pressures as well as renal insufficiency most recent creatinine was 1.29 on August 7 with actually is an improvement was 1.9 for back in May  He does have a history of atrial flutter this appears to be rate controlled he is not on any rate limiting agent he is on aspirin for anticoagulation.  Currently has no acute complaints he is sitting in his chair comfortably is pleasant appropriate does have a history o dementia    Past Medical History  Diagnosis Date  .  Essential hypertension   . Colon cancer   . Atrial flutter   . Dementia   . BPH (benign prostatic hyperplasia)     Past Surgical History  Procedure Laterality Date  . Colon surgery        Medication List       This list is accurate as of: 02/03/15 11:59 PM.  Always use your most recent med list.               acetaminophen 500 MG tablet  Commonly known as:  TYLENOL  Take 500 mg by mouth every 6 (six) hours as needed for mild pain.     aspirin 81 MG EC tablet  Take 1 tablet (81 mg total) by mouth daily.     cholecalciferol 1000 UNITS tablet  Commonly known as:  VITAMIN D  Take 1,000 Units by mouth daily.     divalproex 125 MG capsule  Commonly known as:  DEPAKOTE SPRINKLE  Take 125 mg by mouth daily.     escitalopram 10 MG tablet  Commonly known as:  LEXAPRO  Take 10 mg by mouth daily.     furosemide 20 MG tablet  Commonly known as:  LASIX  Take 3 tablets (60 mg total) by mouth 2 (two) times daily.     JUVEN REVIGOR Pack  Take 1 packet by mouth 2 (two) times daily.     LORazepam 0.5 MG tablet  Commonly known as:  ATIVAN  Take 1 tablet (0.5 mg total) by mouth every 8 (eight) hours as  needed for anxiety.     multivitamin with minerals tablet  Take 1 tablet by mouth daily.     pantoprazole 40 MG tablet  Commonly known as:  PROTONIX  Take 1 tablet (40 mg total) by mouth daily.     tamsulosin 0.4 MG Caps capsule  Commonly known as:  FLOMAX  Take 0.4 mg by mouth daily.     traMADol 50 MG tablet  Commonly known as:  ULTRAM  Take 1 tablet (50 mg total) by mouth every 6 (six) hours as needed. Take 1-2 tablets by mouth every 8 hours for pain     Vitamin D (Ergocalciferol) 50000 UNITS Caps capsule  Commonly known as:  DRISDOL  Take 50,000 Units by mouth every 7 (seven) days.        No orders of the defined types were placed in this encounter.     There is no immunization history on file for this patient.  Social History  Substance Use Topics  .  Smoking status: Never Smoker   . Smokeless tobacco: Never Used  . Alcohol Use: No    Family history is noncontributory    Review of Systems  DATA OBTAINED: from patient, nurse GENERAL:  no fevers, fatigue, appetite changes SKIN: No itching, rash; stage 2 sacral decub EYES: No eye pain, redness, discharge EARS: No earache, tinnitus, change in hearing NOSE: No congestion, drainage or bleeding  MOUTH/THROAT: No mouth or tooth pain, No sore throat RESPIRATORY: No cough, wheezing, SOB CARDIAC: No chest pain, palpitations, +lower extremity edema  GI: No abdominal pain, No N/V/D or constipation, No heartburn or reflux  GU: No dysuria, frequency or urgency, or incontinence  MUSCULOSKELETAL: No unrelieved bone/joint pain NEUROLOGIC: No headache, dizziness or focal weakness PSYCHIATRIC: No overt anxiety or sadness, No behavior issue has history of dementia.   Filed Vitals:   02/03/15 1229  BP: 113/60  Pulse: 60  Temp: 99 F (37.2 C)  Resp: 18    Physical Exam  GENERAL APPEARANCE: Alert, conversant,  No acute distress.  SKIN: No diaphoresis rash--does have wrapping of his feet bilaterally this is followed by wound care and apparently stable HEAD: Normocephalic, atraumatic  EYES: Conjunctiva/lids clear. Pupils round, reactive. EOMs intact.  EARS: External exam WNL, canals clear. Hearing grossly normal.  NOSE: No deformity or discharge.  MOUTH/THROAT: Lips w/o lesions oropharynx is clear mucous membranes moist RESPIRATORY: Breathing is even, unlabored. Lung sounds are decreased   CARDIOVASCULAR: Heart RRR 3/6 systolic murmur, no rubs or gallops.one plus peripheral edema.   GASTROINTESTINAL: Abdomen is soft, non-tender, not distended w/ normal bowel sounds. GENITOURINARY: Bladder non tender, not distended  MUSCULOSKELETAL: No abnormal joints or musculature moves all extremities 4 I did not note any deformities NEUROLOGIC:  Cranial nerves 2-12 grossly intact no lateralizing  findings PSYCHIATRIC: Mood and affect appropriate to situation, no behavioral issues he is pleasant and interactive oriented to self  Patient Active Problem List   Diagnosis Date Noted  . Venous stasis ulcers of both lower extremities 02/20/2015  . Anemia in chronic kidney disease 02/20/2015  . Pain of toes of both feet 01/20/2015  . Localized swelling of both lower legs 01/20/2015  . Persistent atrial fibrillation 12/10/2014  . Dementia 12/10/2014  . Bradycardia 12/10/2014  . Bilateral edema of lower extremity 12/10/2014  . Chronic diastolic heart failure 18/84/1660  . Venous stasis dermatitis of both lower extremities 11/04/2014  . Acute renal failure syndrome   . SOB (shortness of breath)   . Esophageal reflux   .  Atrial flutter 11/01/2014  . Acute on chronic diastolic CHF (congestive heart failure), NYHA class 2 11/01/2014  . Hypertensive heart disease with CHF (congestive heart failure) 11/01/2014  . CKD (chronic kidney disease) stage 3, GFR 30-59 ml/min 11/01/2014  . Dementia with behavioral disturbance 11/01/2014  . CHF (congestive heart failure) 11/01/2014  . Atrial flutter, unspecified   . BPH (benign prostatic hyperplasia)   . Colon cancer     Labs.  02/01/2015.  Sodium 135 potassium 3.8 BUN 55 creatinine 1.29 CBC    Component Value Date/Time   WBC 6.1 11/02/2014 0416   RBC 3.75* 11/02/2014 0416   HGB 11.9* 11/02/2014 0416   HCT 36.0* 11/02/2014 0416   PLT 219 11/02/2014 0416   MCV 96.0 11/02/2014 0416   LYMPHSABS 1.5 11/01/2014 1038   MONOABS 0.9 11/01/2014 1038   EOSABS 0.2 11/01/2014 1038   BASOSABS 0.0 11/01/2014 1038    CMP     Component Value Date/Time   NA 140 11/04/2014 0409   K 4.2 11/04/2014 0409   CL 101 11/04/2014 0409   CO2 28 11/04/2014 0409   GLUCOSE 106* 11/04/2014 0409   BUN 66* 11/04/2014 0409   CREATININE 1.40* 02/10/2015 1245   CALCIUM 8.3* 11/04/2014 0409   PROT 6.1* 11/01/2014 1038   ALBUMIN 3.2* 11/01/2014 1038   AST 16  11/01/2014 1038   ALT 9* 11/01/2014 1038   ALKPHOS 69 11/01/2014 1038   BILITOT 0.6 11/01/2014 1038   GFRNONAA 28* 11/04/2014 0409   GFRAA 33* 11/04/2014 0409    Assessment and Plan  #1-weight gain with what appears to be some increased edema-he is on Lasix 60 mg twice a day I did discuss this with Dr. Sheppard Coil via phone-at this point will switch to Demadex 20 mg twice a day and monitor weights closely clinically he appears to be stable not in any distress-I did per chart review see he did have an echo done back in May 2016 which showed a preserved ejection fraction 55-60 percent it is thought this CHF is largely diastolic related per review of hospital records.  He is not on an ACE inhibitor secondary to his renal issues  He is also not on a beta blocker because of bradycardia pulses here have ranged it appears from 80 down to 49 although it does not look like he has frequent readings in the 40s.  Atrial flutter-with a slow ventricular response-cardiology recommended against and pacemaker no anticoagulation other than aspirin.  Echo did not show any significant valvular disease.  #3 hypertensive heart disease with CHF-this appears to be relatively stable again he is not on an Ace because of chronic renal failure-continues on diarrhetic which is being switched to Bumex.  #4 acute renal failure-this appears to have improved on recent lab with a BUN of 55 creatinine of 2.69-[facility] update a metabolic panel secondary to change in diarrhetic will check this on Friday of this week and also on Wednesday next week to make sure there is stability.  History of BPH he does continue on Flomax apparently this has not been a problem during his stay here.  #6 history of dementia with behavior disturbance-this has not really been an issue during his stay here he is on Depakote as a mood stabilizer and when necessary Ativan nursing has not reported any acute issues or concerns to my knowledge.  #7 history  of venous stasis dermatitis lower extremities-he does have covering of his feet bilaterally is followed by wound care apparently this is  stable.  #8-history of GERD she continues on proton X this appears to be stable.  #9 history of colon cancer this was 30 years prior apparently told his physician in the hospital that had recurred although I do not have much information on this again he did have a chest x-ray which showed scarring versus early neoplastic lesion-he has a CT scan pending which hopefully will give [place] more insight.  #10 history depression-she is on Lexapro appear to be in good spirits he was pleasant and appropriate on exam today.  #11 pain management this appears to be stable is not complaining of pain today does have an order for tramadol 50 mg every 6 hours when necessary also has a Tylenol when necessary order. #12-history anemia I suspect of chronic renal disease last hemoglobin was 11.9 back in May will update this as well.  [id number] note greater than 35 minutes spent assessing patient-reviewing his chart-and coordinating and formulating a plan of care for numerous diagnoses-of note greater than 50% of time spent coordinating plan of care  Tariah Transue C,

## 2015-02-10 ENCOUNTER — Encounter (HOSPITAL_COMMUNITY): Payer: Self-pay

## 2015-02-10 ENCOUNTER — Ambulatory Visit (HOSPITAL_COMMUNITY)
Admission: RE | Admit: 2015-02-10 | Discharge: 2015-02-10 | Disposition: A | Discharge status: Home / Self Care | Payer: Medicare Other | Source: Ambulatory Visit | Attending: Internal Medicine | Admitting: Internal Medicine

## 2015-02-10 DIAGNOSIS — D381 Neoplasm of uncertain behavior of trachea, bronchus and lung: Secondary | ICD-10-CM | POA: Diagnosis present

## 2015-02-10 DIAGNOSIS — Z85038 Personal history of other malignant neoplasm of large intestine: Secondary | ICD-10-CM | POA: Insufficient documentation

## 2015-02-10 DIAGNOSIS — I700 Atherosclerosis of aorta: Secondary | ICD-10-CM | POA: Insufficient documentation

## 2015-02-10 DIAGNOSIS — J432 Centrilobular emphysema: Secondary | ICD-10-CM | POA: Diagnosis not present

## 2015-02-10 DIAGNOSIS — J90 Pleural effusion, not elsewhere classified: Secondary | ICD-10-CM | POA: Diagnosis not present

## 2015-02-10 DIAGNOSIS — I509 Heart failure, unspecified: Secondary | ICD-10-CM | POA: Insufficient documentation

## 2015-02-10 DIAGNOSIS — I517 Cardiomegaly: Secondary | ICD-10-CM | POA: Insufficient documentation

## 2015-02-10 LAB — POCT I-STAT CREATININE: CREATININE: 1.4 mg/dL — AB (ref 0.61–1.24)

## 2015-02-10 MED ORDER — IOHEXOL 300 MG/ML  SOLN
75.0000 mL | Freq: Once | INTRAMUSCULAR | Status: AC | PRN
  Administered 2015-02-10: 75 mL via INTRAVENOUS

## 2015-02-20 ENCOUNTER — Encounter: Payer: Self-pay | Admitting: Internal Medicine

## 2015-02-20 ENCOUNTER — Non-Acute Institutional Stay (SKILLED_NURSING_FACILITY): Payer: Medicare Other | Admitting: Internal Medicine

## 2015-02-20 DIAGNOSIS — D631 Anemia in chronic kidney disease: Secondary | ICD-10-CM | POA: Insufficient documentation

## 2015-02-20 DIAGNOSIS — I83029 Varicose veins of left lower extremity with ulcer of unspecified site: Principal | ICD-10-CM

## 2015-02-20 DIAGNOSIS — N189 Chronic kidney disease, unspecified: Secondary | ICD-10-CM

## 2015-02-20 DIAGNOSIS — I872 Venous insufficiency (chronic) (peripheral): Secondary | ICD-10-CM | POA: Diagnosis not present

## 2015-02-20 DIAGNOSIS — L97919 Non-pressure chronic ulcer of unspecified part of right lower leg with unspecified severity: Principal | ICD-10-CM

## 2015-02-20 DIAGNOSIS — L97929 Non-pressure chronic ulcer of unspecified part of left lower leg with unspecified severity: Principal | ICD-10-CM

## 2015-02-20 DIAGNOSIS — N183 Chronic kidney disease, stage 3 unspecified: Secondary | ICD-10-CM

## 2015-02-20 DIAGNOSIS — I481 Persistent atrial fibrillation: Secondary | ICD-10-CM | POA: Diagnosis not present

## 2015-02-20 DIAGNOSIS — I4819 Other persistent atrial fibrillation: Secondary | ICD-10-CM

## 2015-02-20 DIAGNOSIS — I5032 Chronic diastolic (congestive) heart failure: Secondary | ICD-10-CM | POA: Diagnosis not present

## 2015-02-20 DIAGNOSIS — I83019 Varicose veins of right lower extremity with ulcer of unspecified site: Secondary | ICD-10-CM | POA: Insufficient documentation

## 2015-02-20 NOTE — Assessment & Plan Note (Signed)
8/17 HB 9.4/ HCT 29.5, MCV 92, changed from prior; Plan - anemia profile with iron, B12, folate levels

## 2015-02-20 NOTE — Assessment & Plan Note (Signed)
8/18 BUN 66/CR 1.4 GFR 47, this is baseline for pt;Plan - will follow BMP with increased demedex plusK+

## 2015-02-20 NOTE — Assessment & Plan Note (Signed)
B heels to muscle;pt has been seen by vascular and reported excellent circulation; Plan - bone bx to look for osteo as cause of non healing wounds

## 2015-02-20 NOTE — Progress Notes (Signed)
MRN: [id number] Name: Shade Kaley  Sex: male Age: 81 y.o. DOB: 19-May-1920  [place] #: [facility] Facility/Room:405 Level Of Care: SNF Provider: Inocencio Homes D Emergency Contacts: Extended Emergency Contact Information Primary Emergency Contact: [facility] Address: [address] [place], [place] [address] Johnnette Litter of [place] Phone: [phone] Relation: Daughter Secondary Emergency Contact: Daiva Huge [place] of [place] Phone: [phone] Relation: Son  Code Status: DNR  Allergies: Review of patient's allergies indicates no known allergies.  Chief Complaint  Patient presents with  . Medical Management of Chronic Issues    HPI: Patient is 81 y.o. male with history of atrial flutter [place] VASC -3 who was taken off of Coumadin few years ago because of multiple falls, essential hypertension, dementia, history of colon cancer status post resection 30 years ago,he claims that it might have reoccurred but is not sure who told him that and BPH who is being seen today for weight gain, LE wounds,  renal function, anemia and PAF.  Past Medical History  Diagnosis Date  . Essential hypertension   . Colon cancer   . Atrial flutter   . Dementia   . BPH (benign prostatic hyperplasia)     Past Surgical History  Procedure Laterality Date  . Colon surgery        Medication List       This list is accurate as of: 02/20/15 12:00 PM.  Always use your most recent med list.               acetaminophen 500 MG tablet  Commonly known as:  TYLENOL  Take 500 mg by mouth every 6 (six) hours as needed for mild pain.     aspirin 81 MG EC tablet  Take 1 tablet (81 mg total) by mouth daily.     cholecalciferol 1000 UNITS tablet  Commonly known as:  VITAMIN D  Take 1,000 Units by mouth daily.     divalproex 125 MG capsule  Commonly known as:  DEPAKOTE SPRINKLE  Take 125 mg by mouth daily.     escitalopram 10 MG tablet  Commonly known as:  LEXAPRO  Take  10 mg by mouth daily.     furosemide 20 MG tablet  Commonly known as:  LASIX  Take 3 tablets (60 mg total) by mouth 2 (two) times daily.     JUVEN REVIGOR Pack  Take 1 packet by mouth 2 (two) times daily.     LORazepam 0.5 MG tablet  Commonly known as:  ATIVAN  Take 1 tablet (0.5 mg total) by mouth every 8 (eight) hours as needed for anxiety.     multivitamin with minerals tablet  Take 1 tablet by mouth daily.     pantoprazole 40 MG tablet  Commonly known as:  PROTONIX  Take 1 tablet (40 mg total) by mouth daily.     tamsulosin 0.4 MG Caps capsule  Commonly known as:  FLOMAX  Take 0.4 mg by mouth daily.     traMADol 50 MG tablet  Commonly known as:  ULTRAM  Take 1 tablet (50 mg total) by mouth every 6 (six) hours as needed. Take 1-2 tablets by mouth every 8 hours for pain     Vitamin D (Ergocalciferol) 50000 UNITS Caps capsule  Commonly known as:  DRISDOL  Take 50,000 Units by mouth every 7 (seven) days.        No orders of the defined types were placed  in this encounter.     There is no immunization history on file for this patient.  Social History  Substance Use Topics  . Smoking status: Never Smoker   . Smokeless tobacco: Never Used  . Alcohol Use: No    Review of Systems  DATA OBTAINED: from  nurse, GENERAL:  no fevers, fatigue, appetite has decreased SKIN: No itching, rash; large deep wounds both heels, look cleaner HEENT: No complaint RESPIRATORY: No cough, wheezing, SOB CARDIAC: No chest pain, palpitations, ++ extremity edema  GI: No abdominal pain, No N/V/D or constipation, No heartburn or reflux  GU: No dysuria, frequency or urgency, or incontinence  MUSCULOSKELETAL: No unrelieved bone/joint pain NEUROLOGIC: No headache, dizziness  PSYCHIATRIC: No overt anxiety or sadness  Filed Vitals:   02/20/15 1105  BP: 104/57  Pulse: 63  Temp: 97.7 F (36.5 C)  Resp: 20    Physical Exam  GENERAL APPEARANCE: Alert, No acute distress  SKIN: No  diaphoresis rash; B large heel wounds to muscle, stage 1 great toe HEENT: Unremarkable RESPIRATORY: Breathing is even, unlabored. Lung sounds are clear   CARDIOVASCULAR: Heart irreg 2/6 sys murmur, norubs or gallops. 2+ peripheral edema  GASTROINTESTINAL: Abdomen is soft, non-tender, not distended w/ normal bowel sounds.  GENITOURINARY: Bladder non tender, not distended  MUSCULOSKELETAL: No abnormal joints or musculature NEUROLOGIC: Cranial nerves 2-12 grossly intact PSYCHIATRIC: dementia, no behavioral issues  Patient Active Problem List   Diagnosis Date Noted  . Venous stasis ulcers of both lower extremities 02/20/2015  . Anemia in chronic kidney disease 02/20/2015  . Pain of toes of both feet 01/20/2015  . Localized swelling of both lower legs 01/20/2015  . Persistent atrial fibrillation 12/10/2014  . Dementia 12/10/2014  . Bradycardia 12/10/2014  . Bilateral edema of lower extremity 12/10/2014  . Chronic diastolic heart failure 93/23/5573  . Venous stasis dermatitis of both lower extremities 11/04/2014  . Acute renal failure syndrome   . SOB (shortness of breath)   . Esophageal reflux   . Atrial flutter 11/01/2014  . Acute on chronic diastolic CHF (congestive heart failure), NYHA class 2 11/01/2014  . Hypertensive heart disease with CHF (congestive heart failure) 11/01/2014  . CKD (chronic kidney disease) stage 3, GFR 30-59 ml/min 11/01/2014  . Dementia with behavioral disturbance 11/01/2014  . CHF (congestive heart failure) 11/01/2014  . Atrial flutter, unspecified   . BPH (benign prostatic hyperplasia)   . Colon cancer     CBC    Component Value Date/Time   WBC 6.1 11/02/2014 0416   RBC 3.75* 11/02/2014 0416   HGB 11.9* 11/02/2014 0416   HCT 36.0* 11/02/2014 0416   PLT 219 11/02/2014 0416   MCV 96.0 11/02/2014 0416   LYMPHSABS 1.5 11/01/2014 1038   MONOABS 0.9 11/01/2014 1038   EOSABS 0.2 11/01/2014 1038   BASOSABS 0.0 11/01/2014 1038    CMP     Component  Value Date/Time   NA 140 11/04/2014 0409   K 4.2 11/04/2014 0409   CL 101 11/04/2014 0409   CO2 28 11/04/2014 0409   GLUCOSE 106* 11/04/2014 0409   BUN 66* 11/04/2014 0409   CREATININE 1.40* 02/10/2015 1245   CALCIUM 8.3* 11/04/2014 0409   PROT 6.1* 11/01/2014 1038   ALBUMIN 3.2* 11/01/2014 1038   AST 16 11/01/2014 1038   ALT 9* 11/01/2014 1038   ALKPHOS 69 11/01/2014 1038   BILITOT 0.6 11/01/2014 1038   GFRNONAA 28* 11/04/2014 0409   GFRAA 33* 11/04/2014 0409    Assessment  and Plan  Venous stasis ulcers of both lower extremities B heels to muscle;pt has been seen by vascular and reported excellent circulation; Plan - bone bx to look for osteo as cause of non healing wounds  Chronic diastolic heart failure With reported wight gain since admission of 240 pounds to 263 pounds;early after admit pt was eating well and felt to be from food, now felt to be water; Plan - pt was changed from Lasix 60 mg BID to demadex 20 mg BID; this needs to be increased to 40 mg BID. Last K+ was 3.6 , will need to add K+ po. ; weights Mon and Thurs; will give it a week at this dose , if no improvement will inc demedex dose again  CKD (chronic kidney disease) stage 3, GFR 30-59 ml/min 8/18 BUN 66/CR 1.4 GFR 47, this is baseline for pt;Plan - will follow BMP with increased demedex plusK+  Anemia in chronic kidney disease 8/17 HB 9.4/ HCT 29.5, MCV 92, changed from prior; Plan - anemia profile with iron, B12, folate levels  Persistent atrial fibrillation Rate is controlled; Plan -cont ASA 81mg  as prophylaxis    Hennie Duos, MD

## 2015-02-20 NOTE — Assessment & Plan Note (Signed)
With reported wight gain since admission of 240 pounds to 263 pounds;early after admit pt was eating well and felt to be from food, now felt to be water; Plan - pt was changed from Lasix 60 mg BID to demadex 20 mg BID; this needs to be increased to 40 mg BID. Last K+ was 3.6 , will need to add K+ po. ; weights Mon and Thurs; will give it a week at this dose , if no improvement will inc demedex dose again

## 2015-02-20 NOTE — Assessment & Plan Note (Signed)
Rate is controlled; Plan -cont ASA 81mg  as prophylaxis

## 2015-03-04 ENCOUNTER — Non-Acute Institutional Stay (SKILLED_NURSING_FACILITY): Payer: Medicare Other | Admitting: Internal Medicine

## 2015-03-04 ENCOUNTER — Encounter: Payer: Self-pay | Admitting: Internal Medicine

## 2015-03-04 DIAGNOSIS — R627 Adult failure to thrive: Secondary | ICD-10-CM | POA: Insufficient documentation

## 2015-03-04 DIAGNOSIS — M869 Osteomyelitis, unspecified: Secondary | ICD-10-CM

## 2015-03-04 NOTE — Assessment & Plan Note (Signed)
Pathology from bx R heel returned with dx osteomyelitis, which will be present in L heel as well. To treat will need PICC line and 6-8 weeks antibiotic.Son desires Hospice care for pt without tx.

## 2015-03-04 NOTE — Progress Notes (Signed)
MRN: [id number] Name: Quantae Martel  Sex: male Age: 81 y.o. DOB: 10-17-1919  [place] #: [facility] Facility/Room:405 Level Of Care: SNF Provider: Inocencio Homes D Emergency Contacts: Extended Emergency Contact Information Primary Emergency Contact: [facility] Address: [address] [place], [place] [address] Johnnette Litter of [place] Phone: [phone] Relation: Daughter Secondary Emergency Contact: Daiva Huge [place] of [place] Phone: [phone] Relation: Son  Code Status:DNR   Allergies: Review of patient's allergies indicates no known allergies.  Chief Complaint  Patient presents with  . Acute Visit    HPI: Patient is 81 y.o. male with history of atrial flutter [place] VASC -3 who was taken off of Coumadin few years ago because of multiple falls, essential hypertension, dementia, history of colon cancer status post resection 30 years ago with current ongoing deep ulcers B heels who is being seen today for new dx of osteomyelitis B heels and for consideration of making pt Hospice.  Past Medical History  Diagnosis Date  . Essential hypertension   . Colon cancer   . Atrial flutter   . Dementia   . BPH (benign prostatic hyperplasia)     Past Surgical History  Procedure Laterality Date  . Colon surgery        Medication List       This list is accurate as of: 03/04/15 12:17 PM.  Always use your most recent med list.               acetaminophen 500 MG tablet  Commonly known as:  TYLENOL  Take 500 mg by mouth every 6 (six) hours as needed for mild pain.     aspirin 81 MG EC tablet  Take 1 tablet (81 mg total) by mouth daily.     cholecalciferol 1000 UNITS tablet  Commonly known as:  VITAMIN D  Take 1,000 Units by mouth daily.     divalproex 125 MG capsule  Commonly known as:  DEPAKOTE SPRINKLE  Take 125 mg by mouth daily.     escitalopram 10 MG tablet  Commonly known as:  LEXAPRO  Take 10 mg by mouth daily.     furosemide 20 MG  tablet  Commonly known as:  LASIX  Take 3 tablets (60 mg total) by mouth 2 (two) times daily.     JUVEN REVIGOR Pack  Take 1 packet by mouth 2 (two) times daily.     LORazepam 0.5 MG tablet  Commonly known as:  ATIVAN  Take 1 tablet (0.5 mg total) by mouth every 8 (eight) hours as needed for anxiety.     multivitamin with minerals tablet  Take 1 tablet by mouth daily.     pantoprazole 40 MG tablet  Commonly known as:  PROTONIX  Take 1 tablet (40 mg total) by mouth daily.     tamsulosin 0.4 MG Caps capsule  Commonly known as:  FLOMAX  Take 0.4 mg by mouth daily.     traMADol 50 MG tablet  Commonly known as:  ULTRAM  Take 1 tablet (50 mg total) by mouth every 6 (six) hours as needed. Take 1-2 tablets by mouth every 8 hours for pain     Vitamin D (Ergocalciferol) 50000 UNITS Caps capsule  Commonly known as:  DRISDOL  Take 50,000 Units by mouth every 7 (seven) days.        No orders of the defined types were placed in this encounter.     There is  no immunization history on file for this patient.  Social History  Substance Use Topics  . Smoking status: Never Smoker   . Smokeless tobacco: Never Used  . Alcohol Use: No    Review of Systems  DATA OBTAINED: from patient, nurse GENERAL:  no fevers, fatigue, appetite changes SKIN: new breakdown on sacrum HEENT: No complaint RESPIRATORY: No cough, wheezing, SOB CARDIAC: No chest pain, palpitations, lower extremity edema  GI: No abdominal pain, No N/V/D or constipation, No heartburn or reflux  GU: No dysuria, frequency or urgency, or incontinence  MUSCULOSKELETAL: + bone/joint pain NEUROLOGIC: No headache, dizziness  PSYCHIATRIC: No overt anxiety or sadness  Filed Vitals:   03/04/15 1206  BP: 132/65  Pulse: 68  Temp: 97.9 F (36.6 C)  Resp: 18    Physical Exam  GENERAL APPEARANCE: Alert, conversant, appears uncomfortable SKIN: No diaphoresis rash,  Wounds dressed B heels HEENT: Unremarkable RESPIRATORY:  Breathing is even, unlabored. Lung sounds are clear   CARDIOVASCULAR: Heart RRR no murmurs, rubs or gallops. No peripheral edema  GASTROINTESTINAL: Abdomen is soft, non-tender, not distended w/ normal bowel sounds.  GENITOURINARY: Bladder non tender, not distended  MUSCULOSKELETAL: No abnormal joints or musculature NEUROLOGIC: Cranial nerves 2-12 grossly intact.  PSYCHIATRIC: Mood and affect appropriate, subdued, no behavioral issues  Patient Active Problem List   Diagnosis Date Noted  . Osteomyelitis of foot 03/04/2015  . FTT (failure to thrive) in adult 03/04/2015  . Venous stasis ulcers of both lower extremities 02/20/2015  . Anemia in chronic kidney disease 02/20/2015  . Pain of toes of both feet 01/20/2015  . Localized swelling of both lower legs 01/20/2015  . Persistent atrial fibrillation 12/10/2014  . Dementia 12/10/2014  . Bradycardia 12/10/2014  . Bilateral edema of lower extremity 12/10/2014  . Chronic diastolic heart failure 76/28/3151  . Venous stasis dermatitis of both lower extremities 11/04/2014  . Acute renal failure syndrome   . SOB (shortness of breath)   . Esophageal reflux   . Atrial flutter 11/01/2014  . Acute on chronic diastolic CHF (congestive heart failure), NYHA class 2 11/01/2014  . Hypertensive heart disease with CHF (congestive heart failure) 11/01/2014  . CKD (chronic kidney disease) stage 3, GFR 30-59 ml/min 11/01/2014  . Dementia with behavioral disturbance 11/01/2014  . CHF (congestive heart failure) 11/01/2014  . Atrial flutter, unspecified   . BPH (benign prostatic hyperplasia)   . Colon cancer     CBC    Component Value Date/Time   WBC 6.1 11/02/2014 0416   RBC 3.75* 11/02/2014 0416   HGB 11.9* 11/02/2014 0416   HCT 36.0* 11/02/2014 0416   PLT 219 11/02/2014 0416   MCV 96.0 11/02/2014 0416   LYMPHSABS 1.5 11/01/2014 1038   MONOABS 0.9 11/01/2014 1038   EOSABS 0.2 11/01/2014 1038   BASOSABS 0.0 11/01/2014 1038    CMP      Component Value Date/Time   NA 140 11/04/2014 0409   K 4.2 11/04/2014 0409   CL 101 11/04/2014 0409   CO2 28 11/04/2014 0409   GLUCOSE 106* 11/04/2014 0409   BUN 66* 11/04/2014 0409   CREATININE 1.40* 02/10/2015 1245   CALCIUM 8.3* 11/04/2014 0409   PROT 6.1* 11/01/2014 1038   ALBUMIN 3.2* 11/01/2014 1038   AST 16 11/01/2014 1038   ALT 9* 11/01/2014 1038   ALKPHOS 69 11/01/2014 1038   BILITOT 0.6 11/01/2014 1038   GFRNONAA 28* 11/04/2014 0409   GFRAA 33* 11/04/2014 0409    Assessment and Plan  Osteomyelitis  of foot Pathology from bx R heel returned with dx osteomyelitis, which will be present in L heel as well. To treat will need PICC line and 6-8 weeks antibiotic.Son desires Hospice care for pt without tx.  FTT (failure to thrive) in adult For several months has had decreased po intake which is cause for B heel ulcers and osteo. Reported new breakdown on sacrum. Son has asked for pt to be followed by Hospice for comfort care, no tx of osteo which would , at best, be a long uphill battle that would last the rest of pt's life.   Time spent > 35 min; > 50% of time with patient was spent reviewing records, labs, tests and studies, counseling and developing plan of care  Hennie Duos, MD

## 2015-03-04 NOTE — Assessment & Plan Note (Addendum)
For several months has had decreased po intake which is cause for B heel ulcers and osteo. Reported new breakdown on sacrum. Son has asked for pt to be followed by Hospice for comfort care, no tx of osteo which would , at best, be a long uphill battle that would last the rest of pt's life. Hospice has been consulted.

## 2015-03-06 DIAGNOSIS — I509 Heart failure, unspecified: Secondary | ICD-10-CM | POA: Diagnosis not present

## 2015-03-06 DIAGNOSIS — N189 Chronic kidney disease, unspecified: Secondary | ICD-10-CM | POA: Diagnosis not present

## 2015-03-06 DIAGNOSIS — F0390 Unspecified dementia without behavioral disturbance: Secondary | ICD-10-CM | POA: Diagnosis not present

## 2015-03-18 ENCOUNTER — Ambulatory Visit: Payer: Medicare Other | Admitting: Physician Assistant

## 2015-03-19 ENCOUNTER — Ambulatory Visit: Payer: Medicare Other | Admitting: Physician Assistant

## 2015-03-19 ENCOUNTER — Other Ambulatory Visit: Payer: Self-pay | Admitting: *Deleted

## 2015-03-19 MED ORDER — HYDROCODONE-ACETAMINOPHEN 5-325 MG PO TABS: ORAL_TABLET | ORAL | Status: DC

## 2015-03-19 NOTE — Telephone Encounter (Signed)
[facility] 

## 2015-04-06 ENCOUNTER — Encounter (HOSPITAL_COMMUNITY): Payer: Self-pay | Admitting: Emergency Medicine

## 2015-04-06 ENCOUNTER — Inpatient Hospital Stay (HOSPITAL_COMMUNITY)
Admission: EM | Admit: 2015-04-06 | Discharge: 2015-04-10 | DRG: 592 | Disposition: A | Discharge status: Skilled Nursing Facility | Payer: Medicare Other | Attending: Internal Medicine | Admitting: Internal Medicine

## 2015-04-06 ENCOUNTER — Non-Acute Institutional Stay (SKILLED_NURSING_FACILITY): Payer: Medicare Other | Admitting: Internal Medicine

## 2015-04-06 DIAGNOSIS — F0391 Unspecified dementia with behavioral disturbance: Secondary | ICD-10-CM | POA: Diagnosis present

## 2015-04-06 DIAGNOSIS — K219 Gastro-esophageal reflux disease without esophagitis: Secondary | ICD-10-CM | POA: Diagnosis present

## 2015-04-06 DIAGNOSIS — R296 Repeated falls: Secondary | ICD-10-CM | POA: Diagnosis present

## 2015-04-06 DIAGNOSIS — D638 Anemia in other chronic diseases classified elsewhere: Secondary | ICD-10-CM | POA: Diagnosis present

## 2015-04-06 DIAGNOSIS — I4892 Unspecified atrial flutter: Secondary | ICD-10-CM | POA: Diagnosis present

## 2015-04-06 DIAGNOSIS — I96 Gangrene, not elsewhere classified: Secondary | ICD-10-CM | POA: Diagnosis present

## 2015-04-06 DIAGNOSIS — E860 Dehydration: Secondary | ICD-10-CM | POA: Diagnosis present

## 2015-04-06 DIAGNOSIS — Z66 Do not resuscitate: Secondary | ICD-10-CM | POA: Diagnosis present

## 2015-04-06 DIAGNOSIS — R32 Unspecified urinary incontinence: Secondary | ICD-10-CM | POA: Diagnosis present

## 2015-04-06 DIAGNOSIS — I739 Peripheral vascular disease, unspecified: Secondary | ICD-10-CM | POA: Diagnosis present

## 2015-04-06 DIAGNOSIS — L97909 Non-pressure chronic ulcer of unspecified part of unspecified lower leg with unspecified severity: Secondary | ICD-10-CM | POA: Diagnosis present

## 2015-04-06 DIAGNOSIS — N183 Chronic kidney disease, stage 3 (moderate): Secondary | ICD-10-CM | POA: Diagnosis present

## 2015-04-06 DIAGNOSIS — M868X7 Other osteomyelitis, ankle and foot: Secondary | ICD-10-CM | POA: Diagnosis present

## 2015-04-06 DIAGNOSIS — L89623 Pressure ulcer of left heel, stage 3: Secondary | ICD-10-CM | POA: Diagnosis present

## 2015-04-06 DIAGNOSIS — N400 Enlarged prostate without lower urinary tract symptoms: Secondary | ICD-10-CM | POA: Diagnosis present

## 2015-04-06 DIAGNOSIS — A419 Sepsis, unspecified organism: Secondary | ICD-10-CM | POA: Diagnosis not present

## 2015-04-06 DIAGNOSIS — T148 Other injury of unspecified body region: Secondary | ICD-10-CM | POA: Diagnosis not present

## 2015-04-06 DIAGNOSIS — I83029 Varicose veins of left lower extremity with ulcer of unspecified site: Secondary | ICD-10-CM

## 2015-04-06 DIAGNOSIS — Z85038 Personal history of other malignant neoplasm of large intestine: Secondary | ICD-10-CM

## 2015-04-06 DIAGNOSIS — Z515 Encounter for palliative care: Secondary | ICD-10-CM | POA: Diagnosis not present

## 2015-04-06 DIAGNOSIS — Z79899 Other long term (current) drug therapy: Secondary | ICD-10-CM | POA: Diagnosis not present

## 2015-04-06 DIAGNOSIS — D62 Acute posthemorrhagic anemia: Secondary | ICD-10-CM | POA: Diagnosis present

## 2015-04-06 DIAGNOSIS — F0390 Unspecified dementia without behavioral disturbance: Secondary | ICD-10-CM | POA: Diagnosis not present

## 2015-04-06 DIAGNOSIS — E43 Unspecified severe protein-calorie malnutrition: Secondary | ICD-10-CM | POA: Diagnosis present

## 2015-04-06 DIAGNOSIS — I130 Hypertensive heart and chronic kidney disease with heart failure and stage 1 through stage 4 chronic kidney disease, or unspecified chronic kidney disease: Secondary | ICD-10-CM | POA: Diagnosis present

## 2015-04-06 DIAGNOSIS — H9190 Unspecified hearing loss, unspecified ear: Secondary | ICD-10-CM | POA: Diagnosis present

## 2015-04-06 DIAGNOSIS — L89323 Pressure ulcer of left buttock, stage 3: Secondary | ICD-10-CM | POA: Diagnosis not present

## 2015-04-06 DIAGNOSIS — I5032 Chronic diastolic (congestive) heart failure: Secondary | ICD-10-CM | POA: Diagnosis present

## 2015-04-06 DIAGNOSIS — R159 Full incontinence of feces: Secondary | ICD-10-CM | POA: Diagnosis present

## 2015-04-06 DIAGNOSIS — N179 Acute kidney failure, unspecified: Secondary | ICD-10-CM | POA: Diagnosis present

## 2015-04-06 DIAGNOSIS — N390 Urinary tract infection, site not specified: Secondary | ICD-10-CM

## 2015-04-06 DIAGNOSIS — I83009 Varicose veins of unspecified lower extremity with ulcer of unspecified site: Secondary | ICD-10-CM | POA: Diagnosis present

## 2015-04-06 DIAGNOSIS — L8990 Pressure ulcer of unspecified site, unspecified stage: Secondary | ICD-10-CM | POA: Diagnosis present

## 2015-04-06 DIAGNOSIS — L97929 Non-pressure chronic ulcer of unspecified part of left lower leg with unspecified severity: Secondary | ICD-10-CM

## 2015-04-06 DIAGNOSIS — Z7982 Long term (current) use of aspirin: Secondary | ICD-10-CM | POA: Diagnosis not present

## 2015-04-06 DIAGNOSIS — L89613 Pressure ulcer of right heel, stage 3: Secondary | ICD-10-CM | POA: Diagnosis present

## 2015-04-06 DIAGNOSIS — T148XXA Other injury of unspecified body region, initial encounter: Secondary | ICD-10-CM

## 2015-04-06 DIAGNOSIS — L97919 Non-pressure chronic ulcer of unspecified part of right lower leg with unspecified severity: Secondary | ICD-10-CM

## 2015-04-06 DIAGNOSIS — I83019 Varicose veins of right lower extremity with ulcer of unspecified site: Secondary | ICD-10-CM | POA: Diagnosis present

## 2015-04-06 HISTORY — DX: Osteomyelitis, unspecified: M86.9

## 2015-04-06 HISTORY — DX: Unspecified diastolic (congestive) heart failure: I50.30

## 2015-04-06 LAB — BASIC METABOLIC PANEL
Anion gap: 11 (ref 5–15)
BUN: 89 mg/dL — AB (ref 6–20)
CALCIUM: 8.5 mg/dL — AB (ref 8.9–10.3)
CO2: 27 mmol/L (ref 22–32)
CREATININE: 1.71 mg/dL — AB (ref 0.61–1.24)
Chloride: 97 mmol/L — ABNORMAL LOW (ref 101–111)
GFR calc non Af Amer: 33 mL/min — ABNORMAL LOW (ref >60)
GFR, EST AFRICAN AMERICAN: 38 mL/min — AB (ref >60)
Glucose, Bld: 122 mg/dL — ABNORMAL HIGH (ref 65–99)
Potassium: 4.1 mmol/L (ref 3.5–5.1)
SODIUM: 135 mmol/L (ref 135–145)

## 2015-04-06 LAB — URINE MICROSCOPIC-ADD ON

## 2015-04-06 LAB — CBC WITH DIFFERENTIAL/PLATELET
BASOS PCT: 0 %
Basophils Absolute: 0 10*3/uL (ref 0.0–0.1)
EOS ABS: 0.1 10*3/uL (ref 0.0–0.7)
EOS PCT: 1 %
HCT: 31.2 % — ABNORMAL LOW (ref 39.0–52.0)
HEMOGLOBIN: 9.9 g/dL — AB (ref 13.0–17.0)
Lymphocytes Relative: 9 %
Lymphs Abs: 1.1 10*3/uL (ref 0.7–4.0)
MCH: 27.8 pg (ref 26.0–34.0)
MCHC: 31.7 g/dL (ref 30.0–36.0)
MCV: 87.6 fL (ref 78.0–100.0)
MONOS PCT: 10 %
Monocytes Absolute: 1.3 10*3/uL — ABNORMAL HIGH (ref 0.1–1.0)
NEUTROS PCT: 80 %
Neutro Abs: 9.8 10*3/uL — ABNORMAL HIGH (ref 1.7–7.7)
PLATELETS: 426 10*3/uL — AB (ref 150–400)
RBC: 3.56 MIL/uL — ABNORMAL LOW (ref 4.22–5.81)
RDW: 17.2 % — ABNORMAL HIGH (ref 11.5–15.5)
WBC: 12.3 10*3/uL — AB (ref 4.0–10.5)

## 2015-04-06 LAB — URINALYSIS, ROUTINE W REFLEX MICROSCOPIC
BILIRUBIN URINE: NEGATIVE
GLUCOSE, UA: NEGATIVE mg/dL
KETONES UR: NEGATIVE mg/dL
Nitrite: NEGATIVE
PROTEIN: 30 mg/dL — AB
Specific Gravity, Urine: 1.01 (ref 1.005–1.030)
UROBILINOGEN UA: 0.2 mg/dL (ref 0.0–1.0)
pH: 7.5 (ref 5.0–8.0)

## 2015-04-06 LAB — PROTIME-INR
INR: 1.19 (ref 0.00–1.49)
PROTHROMBIN TIME: 15.3 s — AB (ref 11.6–15.2)

## 2015-04-06 MED ORDER — PANTOPRAZOLE SODIUM 40 MG PO TBEC
40.0000 mg | DELAYED_RELEASE_TABLET | Freq: Every day | ORAL | Status: DC
  Administered 2015-04-07 – 2015-04-10 (×4): 40 mg via ORAL
  Filled 2015-04-06 (×5): qty 1

## 2015-04-06 MED ORDER — LORAZEPAM 0.5 MG PO TABS: 0.5000 mg | ORAL_TABLET | Freq: Three times a day (TID) | ORAL | Status: DC | PRN

## 2015-04-06 MED ORDER — SODIUM CHLORIDE 0.9 % IV BOLUS (SEPSIS)
1000.0000 mL | Freq: Once | INTRAVENOUS | Status: AC
  Administered 2015-04-06: 1000 mL via INTRAVENOUS

## 2015-04-06 MED ORDER — TAMSULOSIN HCL 0.4 MG PO CAPS
0.4000 mg | ORAL_CAPSULE | Freq: Every day | ORAL | Status: DC
  Administered 2015-04-07 – 2015-04-10 (×4): 0.4 mg via ORAL
  Filled 2015-04-06 (×4): qty 1

## 2015-04-06 MED ORDER — ESCITALOPRAM OXALATE 10 MG PO TABS
10.0000 mg | ORAL_TABLET | Freq: Every day | ORAL | Status: DC
  Administered 2015-04-07 – 2015-04-10 (×4): 10 mg via ORAL
  Filled 2015-04-06 (×4): qty 1

## 2015-04-06 MED ORDER — HYDROCODONE-ACETAMINOPHEN 5-325 MG PO TABS
1.0000 | ORAL_TABLET | Freq: Four times a day (QID) | ORAL | Status: DC | PRN
  Administered 2015-04-07: 1 via ORAL
  Filled 2015-04-06: qty 1

## 2015-04-06 MED ORDER — FUROSEMIDE 40 MG PO TABS
40.0000 mg | ORAL_TABLET | Freq: Two times a day (BID) | ORAL | Status: DC
  Administered 2015-04-07 – 2015-04-10 (×6): 40 mg via ORAL
  Filled 2015-04-06 (×6): qty 1

## 2015-04-06 MED ORDER — DIVALPROEX SODIUM 125 MG PO CSDR
125.0000 mg | DELAYED_RELEASE_CAPSULE | Freq: Every day | ORAL | Status: DC
  Administered 2015-04-07 – 2015-04-10 (×5): 125 mg via ORAL
  Filled 2015-04-06 (×5): qty 1

## 2015-04-06 MED ORDER — POTASSIUM CHLORIDE 20 MEQ PO PACK
20.0000 meq | PACK | Freq: Once | ORAL | Status: DC
  Filled 2015-04-06: qty 1

## 2015-04-06 MED ORDER — ACETAMINOPHEN 500 MG PO TABS: 500.0000 mg | ORAL_TABLET | Freq: Four times a day (QID) | ORAL | Status: DC | PRN

## 2015-04-06 MED ORDER — TRAZODONE HCL 50 MG PO TABS
50.0000 mg | ORAL_TABLET | Freq: Every day | ORAL | Status: DC
  Administered 2015-04-07 – 2015-04-09 (×4): 50 mg via ORAL
  Filled 2015-04-06 (×3): qty 1

## 2015-04-06 MED ORDER — POTASSIUM CHLORIDE CRYS ER 20 MEQ PO TBCR
20.0000 meq | EXTENDED_RELEASE_TABLET | Freq: Once | ORAL | Status: AC
  Administered 2015-04-07: 20 meq via ORAL
  Filled 2015-04-06: qty 1

## 2015-04-06 MED ORDER — SODIUM CHLORIDE 0.9 % IV SOLN
INTRAVENOUS | Status: DC
Start: 2015-04-06 — End: 2015-04-08
  Administered 2015-04-07 (×2): via INTRAVENOUS

## 2015-04-06 MED ORDER — CEFTRIAXONE SODIUM 1 G IJ SOLR
1.0000 g | INTRAMUSCULAR | Status: DC
  Administered 2015-04-06: 1 g via INTRAVENOUS
  Filled 2015-04-06 (×2): qty 10

## 2015-04-06 NOTE — ED Notes (Signed)
676ml of urine from in and out cath. At end of void, white puss came out of penis

## 2015-04-06 NOTE — ED Provider Notes (Signed)
CSN: [id number]     Arrival date & time 04/06/15  1600 History   First MD Initiated Contact with Patient 04/06/15 1646     Chief Complaint  Patient presents with  . Pressure Ulcer     (Consider location/radiation/quality/duration/timing/severity/associated sxs/prior Treatment) HPI Comments: 81 year old male with past medical history including dementia, diastolic heart failure, BPH, atrial flutter, colon cancer who presents with bleeding decubitus ulcer. History limited due to the patient's dementia and obtained primarily from nursing home report and EMS. According to nursing home, the patient has an ulcer on his left buttock and staff was to bring it today when it started bleeding. Due to ongoing bleeding, EMS was called. Wound was oozing on EMS arrival. Patient is not on any anticoagulation. Patient unable to provide any further history.  The history is provided by the EMS personnel and the nursing home.    Past Medical History  Diagnosis Date  . Essential hypertension   . Colon cancer ([place])   . Atrial flutter ([place])   . Dementia   . BPH (benign prostatic hyperplasia)   . Diastolic heart failure ([place])   . Osteomyelitis of ankle and foot [facility])    Past Surgical History  Procedure Laterality Date  . Colon surgery     Family History  Problem Relation Age of Onset  . Family history unknown: Yes   Social History  Substance Use Topics  . Smoking status: Never Smoker   . Smokeless tobacco: Never Used  . Alcohol Use: No    Review of Systems  Unable to perform ROS: Dementia      Allergies  Review of patient's allergies indicates no known allergies.  Home Medications   Prior to Admission medications   Medication Sig Start Date End Date Taking? Authorizing Provider  acetaminophen (TYLENOL) 500 MG tablet Take 500 mg by mouth every 6 (six) hours as needed for mild pain.    Historical Provider, MD  aspirin EC 81 MG EC tablet Take 1 tablet (81 mg total) by mouth daily. 11/03/14    [name], MD  cholecalciferol (VITAMIN D) 1000 UNITS tablet Take 1,000 Units by mouth daily.    Historical Provider, MD  divalproex (DEPAKOTE SPRINKLE) 125 MG capsule Take 125 mg by mouth daily.    Historical Provider, MD  escitalopram (LEXAPRO) 10 MG tablet Take 10 mg by mouth daily.    Historical Provider, MD  furosemide (LASIX) 20 MG tablet Take 3 tablets (60 mg total) by mouth 2 (two) times daily. 11/06/14   [name], MD  HYDROcodone-acetaminophen (NORCO/VICODIN) 5-325 MG per tablet Take one tablet by mouth every 6 hours for pain. Hold for somnolence/drowiness 03/19/15   Tiffany L Reed, DO  LORazepam (ATIVAN) 0.5 MG tablet Take 1 tablet (0.5 mg total) by mouth every 8 (eight) hours as needed for anxiety. Patient not taking: Reported on 01/20/2015 11/03/14   [name], MD  Multiple Vitamins-Minerals (MULTIVITAMIN WITH MINERALS) tablet Take 1 tablet by mouth daily.    Historical Provider, MD  Nutritional Supplements (JUVEN REVIGOR) PACK Take 1 packet by mouth 2 (two) times daily.    Historical Provider, MD  pantoprazole (PROTONIX) 40 MG tablet Take 1 tablet (40 mg total) by mouth daily. 11/03/14   [name], MD  tamsulosin (FLOMAX) 0.4 MG CAPS capsule Take 0.4 mg by mouth daily.     Historical Provider, MD  traMADol (ULTRAM) 50 MG tablet Take 1 tablet (50 mg total) by mouth every 6 (six) hours as needed. Take 1-2 tablets  by mouth every 8 hours for pain 11/03/14   [name], MD  Vitamin D, Ergocalciferol, (DRISDOL) 50000 UNITS CAPS capsule Take 50,000 Units by mouth every 7 (seven) days.    Historical Provider, MD   BP 126/76 mmHg  Pulse 64  Temp(Src) 98 F (36.7 C) (Oral)  Resp 18  SpO2 98% Physical Exam  Constitutional:  Frail, thin elderly man in no acute distress  HENT:  Head: Normocephalic and atraumatic.  Moist mucous membranes, partial dentures lower teeth  Eyes: Conjunctivae are normal. Pupils are equal, round, and reactive to light.  Neck: Neck supple.   Cardiovascular: Normal rate, regular rhythm and normal heart sounds.   No murmur heard. Pulmonary/Chest: Effort normal and breath sounds normal.  Abdominal: Soft. Bowel sounds are normal. He exhibits distension.  Distention of lower abdomen in area of bladder with mild discomfort during palpation, no rebound or guarding  Genitourinary:  Brown stool, purulent urine coming from urethra  Musculoskeletal: He exhibits no edema.  Neurological:  Awake, unable to answer questions  Skin:  4-5 cm circular deep, unstageable ulcer on lower left buttock/upper thigh near perineum w/ blood clots, no active bleeding, no tenderness  Nursing note and vitals reviewed.   ED Course  Procedures (including critical care time) Labs Review Labs Reviewed  BASIC METABOLIC PANEL - Abnormal; Notable for the following:    Chloride 97 (*)    Glucose, Bld 122 (*)    BUN 89 (*)    Creatinine, Ser 1.71 (*)    Calcium 8.5 (*)    GFR calc non Af Amer 33 (*)    GFR calc Af Amer 38 (*)    All other components within normal limits  CBC WITH DIFFERENTIAL/PLATELET - Abnormal; Notable for the following:    WBC 12.3 (*)    RBC 3.56 (*)    Hemoglobin 9.9 (*)    HCT 31.2 (*)    RDW 17.2 (*)    Platelets 426 (*)    Neutro Abs 9.8 (*)    Monocytes Absolute 1.3 (*)    All other components within normal limits  URINALYSIS, ROUTINE W REFLEX MICROSCOPIC (NOT AT [facility]) - Abnormal; Notable for the following:    APPearance TURBID (*)    Hgb urine dipstick LARGE (*)    Protein, ur 30 (*)    Leukocytes, UA LARGE (*)    All other components within normal limits  PROTIME-INR - Abnormal; Notable for the following:    Prothrombin Time 15.3 (*)    All other components within normal limits  URINE MICROSCOPIC-ADD ON - Abnormal; Notable for the following:    Squamous Epithelial / LPF FEW (*)    Bacteria, UA MANY (*)    All other components within normal limits  URINE CULTURE    Imaging Review No results found. I have  personally reviewed and evaluated these  lab results as part of my medical decision-making.   EKG Interpretation None      Medications  cefTRIAXone (ROCEPHIN) 1 g in dextrose 5 % 50 mL IVPB (1 g Intravenous New Bag/Given 04/06/15 1808)  sodium chloride 0.9 % bolus 1,000 mL (1,000 mLs Intravenous New Bag/Given 04/06/15 1808)    MDM   Final diagnoses:  Urinary tract infection in elderly patient  Decubitus ulcer   81yo M w/ known decubitus ulcer who p/w bleeding from ulcer after it was debrided at his nursing home earlier today. On arrival by EMS, the patient was awake, alert, and in no acute distress.  Vital signs stable on 2 L home oxygen. He had a large, deep, unstageable ulcer on his left lower buttock with surrounding blood clots but no active bleeding. He also had purulent urine. Obtained above labs as well as I and O cath which yielded greater than 600 ML's frankly purulent urine. Obtained a urine culture. Labs show stable CK D8 creatinine 1.71, WBC 12.3, UA with large amount of WBCs and bacteria. Gave the patient ceftriaxone and an IV fluid bolus. Because of his urinary retention, obvious urinary tract infection, and decubitus ulcer which needs significant wound care, the patient will be admitted to general medicine.    Sharlett Iles, MD 04/06/15 [phone]

## 2015-04-06 NOTE — H&P (Signed)
History and Physical  Kenneth Edwards FMB:[id number] DOB: 09-29-1919 DOA: 04/06/2015  PCP: Hennie Duos, MD   Chief Complaint: decub ulcer bleeding post debridement   History of Present Illness:  Patient is a 81 year old male with history of dementia,diastolic HF,BPH, atrial flutter ( off AC due to falls),colon cancer who presented to the Ed due to bleeding on decub ulcer site post debridement.He has no complaints but can't provide much history. History was taken from ER. He has a stage III decub ulcer on left buttock but pus was noted near urethral opening with urine cath showing pus post in/out lavage. He was found to have mild leukocytosis with UA suspicious of UTI. He was admitted for sepsis.  Review of Systems:  CONSTITUTIONAL:  No night sweats.  No fatigue, malaise, lethargy.  No fever or chills. Eyes:  No visual changes.  No eye pain.  No eye discharge.   ENT:    No epistaxis.  No sinus pain.  No sore throat.  No ear pain.  No congestion. RESPIRATORY:  No cough.  No wheeze.  No hemoptysis.  No shortness of breath. CARDIOVASCULAR:  No chest pains.  No palpitations. GASTROINTESTINAL:  No abdominal pain.  No nausea or vomiting.  No diarrhea or constipation.  No hematemesis.  No hematochezia.  No melena. GENITOURINARY:  No urgency.  No frequency.  No dysuria.  No hematuria.  No obstructive symptoms.  No discharge.  No pain.  No significant abnormal bleeding. MUSCULOSKELETAL:  No musculoskeletal pain.  No joint swelling.  No arthritis. NEUROLOGICAL:    No weakness. No headache. No seizure. PSYCHIATRIC:  No depression. No anxiety. No suicidal ideation. SKIN:  No rashes.  No lesions.  No wounds.  ENDOCRINE:  No unexplained weight loss.  No polydipsia.  No polyuria.  No polyphagia. HEMATOLOGIC:  No anemia.  No purpura.  No petechiae.  No bleeding.  ALLERGIC AND IMMUNOLOGIC:  No pruritus.  No swelling Other:  Past Medical and Surgical History:   Past Medical History    Diagnosis Date  . Essential hypertension   . Colon cancer ([place])   . Atrial flutter ([place])   . Dementia   . BPH (benign prostatic hyperplasia)   . Diastolic heart failure ([place])   . Osteomyelitis of ankle and foot [facility])    Past Surgical History  Procedure Laterality Date  . Colon surgery      Social History:   reports that he has never smoked. He has never used smokeless tobacco. He reports that he does not drink alcohol or use illicit drugs.  No Known Allergies  Family History  Problem Relation Age of Onset  . Family history unknown: Yes    Prior to Admission medications   Medication Sig Start Date End Date Taking? Authorizing Provider  acetaminophen (TYLENOL) 500 MG tablet Take 500 mg by mouth every 6 (six) hours as needed for mild pain.   Yes Historical Provider, MD  aspirin EC 81 MG EC tablet Take 1 tablet (81 mg total) by mouth daily. 11/03/14  Yes [name], MD  escitalopram (LEXAPRO) 10 MG tablet Take 10 mg by mouth daily.   Yes Historical Provider, MD  furosemide (LASIX) 20 MG tablet Take 3 tablets (60 mg total) by mouth 2 (two) times daily. Patient taking differently: Take 40 mg by mouth 2 (two) times daily.  11/06/14  Yes [name], MD  HYDROcodone-acetaminophen (NORCO/VICODIN) 5-325 MG per tablet Take one tablet by mouth every 6 hours for pain. Hold for  somnolence/drowiness 03/19/15  Yes Tiffany L Reed, DO  LORazepam (ATIVAN) 0.5 MG tablet Take 1 tablet (0.5 mg total) by mouth every 8 (eight) hours as needed for anxiety. 11/03/14  Yes [name], MD  Multiple Vitamins-Minerals (MULTIVITAMIN WITH MINERALS) tablet Take 1 tablet by mouth daily.   Yes Historical Provider, MD  Nutritional Supplements (JUVEN REVIGOR) PACK Take 1 packet by mouth 2 (two) times daily.   Yes Historical Provider, MD  pantoprazole (PROTONIX) 40 MG tablet Take 1 tablet (40 mg total) by mouth daily. 11/03/14  Yes [name], MD  potassium chloride (KLOR-CON) 20 MEQ packet Take 20 mEq by mouth  once.   Yes Historical Provider, MD  tamsulosin (FLOMAX) 0.4 MG CAPS capsule Take 0.4 mg by mouth daily.    Yes Historical Provider, MD  traMADol (ULTRAM) 50 MG tablet Take 1 tablet (50 mg total) by mouth every 6 (six) hours as needed. Take 1-2 tablets by mouth every 8 hours for pain Patient taking differently: Take 50 mg by mouth every 6 (six) hours as needed.  11/03/14  Yes [name], MD  traZODone (DESYREL) 50 MG tablet Take 50 mg by mouth at bedtime.   Yes Historical Provider, MD  Vitamin D, Ergocalciferol, (DRISDOL) 50000 UNITS CAPS capsule Take 50,000 Units by mouth every 7 (seven) days.   Yes Historical Provider, MD  divalproex (DEPAKOTE SPRINKLE) 125 MG capsule Take 125 mg by mouth daily.    Historical Provider, MD    Physical Exam: BP 134/56 mmHg  Pulse 78  Temp(Src) 98.7 F (37.1 C) (Oral)  Resp 15  SpO2 94%  GENERAL : Well developed, well nourished, alert and cooperative, and appears to be in no acute distress. HEAD: normocephalic. EYES: PERRL, EOMI.  EARS: mild hearing loss bilaterally NOSE: No nasal discharge. THROAT: Oral cavity and pharynx normal. NECK: Neck supple, non-tender . CARDIAC: Normal S1 and S2. No S3, S4 or murmurs. Rhythm is regular.  LUNGS: Clear to auscultation ABDOMEN: Positive bowel sounds. Soft, distended, nontender. No guarding or rebound. No masses. EXTREMITIES: bruises on arms due to fall from bed in his room on the floor. NEUROLOGICAL:oriented to palce. SKIN: stageIII decub ulcer on left buttock, stage III decub ulcer on right heel and left heel, necrotic left big toe.          Labs on Admission:  Reviewed.   Radiological Exams on Admission: No results found.   Assessment/Plan  Sepsis due to UTI:  Started on ceftriaxone Ucx and Bcx sent  Decub ulcers: Left buttock, heels bilaterally  Wound care consult S/p debridement  Surgery consult for left necrotic toe Started on vancomycin   Aflutter : off AC due to recurrent  falls.  BPH: on flomax  Dementia  CKD: Cr at baseline  Bleeding from decub ulcer:  Hb down from baseline with increased RDW But very minimal bleeding from site. Will monitor Hb, if dropped should get abd /pelvis CT    DVT prophylaxis: SCDs. Consultants: surgery Code Status: DNR/DNI. No family to discuss but patient has multiple comorbidities and dementia   Gennaro Africa M.D [facility]

## 2015-04-06 NOTE — ED Notes (Addendum)
Per EMS. Pt from [employer] home. Pt has a grapefruit size pressure ulcer on buttocks. Staff where debriding pressure sore when it started bleeding. Called EMS due to bleeding. EMS stated pressure ulcer was oozing upon arrival. Pt has hx of dementia and is hard of hearing. Pt on aspirin but no other blood thinners.

## 2015-04-06 NOTE — ED Notes (Signed)
Delay in lab draw, tech getting urine from pt

## 2015-04-06 NOTE — ED Notes (Signed)
Bed: WA14 Expected date:  Expected time:  Means of arrival:  Comments: ems 

## 2015-04-07 DIAGNOSIS — L8990 Pressure ulcer of unspecified site, unspecified stage: Secondary | ICD-10-CM | POA: Insufficient documentation

## 2015-04-07 DIAGNOSIS — F0391 Unspecified dementia with behavioral disturbance: Secondary | ICD-10-CM

## 2015-04-07 DIAGNOSIS — I5032 Chronic diastolic (congestive) heart failure: Secondary | ICD-10-CM

## 2015-04-07 DIAGNOSIS — K219 Gastro-esophageal reflux disease without esophagitis: Secondary | ICD-10-CM

## 2015-04-07 LAB — COMPREHENSIVE METABOLIC PANEL
ALBUMIN: 2.2 g/dL — AB (ref 3.5–5.0)
ALT: 9 U/L — AB (ref 17–63)
AST: 11 U/L — ABNORMAL LOW (ref 15–41)
Alkaline Phosphatase: 53 U/L (ref 38–126)
Anion gap: 8 (ref 5–15)
BUN: 77 mg/dL — ABNORMAL HIGH (ref 6–20)
CHLORIDE: 102 mmol/L (ref 101–111)
CO2: 28 mmol/L (ref 22–32)
CREATININE: 1.4 mg/dL — AB (ref 0.61–1.24)
Calcium: 8.4 mg/dL — ABNORMAL LOW (ref 8.9–10.3)
GFR calc non Af Amer: 42 mL/min — ABNORMAL LOW (ref >60)
GFR, EST AFRICAN AMERICAN: 48 mL/min — AB (ref >60)
Glucose, Bld: 114 mg/dL — ABNORMAL HIGH (ref 65–99)
Potassium: 4.1 mmol/L (ref 3.5–5.1)
SODIUM: 138 mmol/L (ref 135–145)
Total Bilirubin: 0.5 mg/dL (ref 0.3–1.2)
Total Protein: 6.2 g/dL — ABNORMAL LOW (ref 6.5–8.1)

## 2015-04-07 LAB — URINE CULTURE: Special Requests: NORMAL

## 2015-04-07 LAB — CBC WITH DIFFERENTIAL/PLATELET
Basophils Absolute: 0 10*3/uL (ref 0.0–0.1)
Basophils Relative: 0 %
EOS ABS: 0.1 10*3/uL (ref 0.0–0.7)
Eosinophils Relative: 2 %
HCT: 30.2 % — ABNORMAL LOW (ref 39.0–52.0)
HEMOGLOBIN: 9.5 g/dL — AB (ref 13.0–17.0)
LYMPHS ABS: 1.3 10*3/uL (ref 0.7–4.0)
Lymphocytes Relative: 14 %
MCH: 27.5 pg (ref 26.0–34.0)
MCHC: 31.5 g/dL (ref 30.0–36.0)
MCV: 87.3 fL (ref 78.0–100.0)
MONOS PCT: 15 %
Monocytes Absolute: 1.5 10*3/uL — ABNORMAL HIGH (ref 0.1–1.0)
NEUTROS ABS: 6.5 10*3/uL (ref 1.7–7.7)
NEUTROS PCT: 69 %
Platelets: 396 10*3/uL (ref 150–400)
RBC: 3.46 MIL/uL — ABNORMAL LOW (ref 4.22–5.81)
RDW: 17.4 % — ABNORMAL HIGH (ref 11.5–15.5)
WBC: 9.4 10*3/uL (ref 4.0–10.5)

## 2015-04-07 MED ORDER — PRO-STAT SUGAR FREE PO LIQD
30.0000 mL | Freq: Every day | ORAL | Status: DC
  Administered 2015-04-08 – 2015-04-10 (×3): 30 mL via ORAL
  Filled 2015-04-07 (×3): qty 30

## 2015-04-07 MED ORDER — ENSURE ENLIVE PO LIQD
237.0000 mL | Freq: Two times a day (BID) | ORAL | Status: DC
  Administered 2015-04-07 – 2015-04-10 (×7): 237 mL via ORAL

## 2015-04-07 MED ORDER — DEXTROSE 5 % IV SOLN
1.0000 g | INTRAVENOUS | Status: DC
  Administered 2015-04-07 – 2015-04-09 (×3): 1 g via INTRAVENOUS
  Filled 2015-04-07 (×3): qty 10

## 2015-04-07 MED ORDER — ADULT MULTIVITAMIN W/MINERALS CH
1.0000 | ORAL_TABLET | Freq: Every day | ORAL | Status: DC
  Administered 2015-04-08 – 2015-04-10 (×3): 1 via ORAL
  Filled 2015-04-07 (×3): qty 1

## 2015-04-07 MED ORDER — POLYSACCHARIDE IRON COMPLEX 150 MG PO CAPS
150.0000 mg | ORAL_CAPSULE | Freq: Every day | ORAL | Status: DC
  Administered 2015-04-07 – 2015-04-10 (×4): 150 mg via ORAL
  Filled 2015-04-07 (×4): qty 1

## 2015-04-07 NOTE — Plan of Care (Signed)
Problem: Consults Goal: General Medical Patient Education See Patient Education Module for specific education. Outcome: Progressing UTI Stage III decub

## 2015-04-07 NOTE — Progress Notes (Addendum)
Initial Nutrition Assessment  DOCUMENTATION CODES:   Severe malnutrition in context of chronic illness  INTERVENTION:   -Need current weight obtained for this admission -Provide Ensure Enlive po BID, each supplement provides 350 kcal and 20 grams of protein -Provide 30 ml Prostat po daily -Multivitamin with minerals daily -Encourage PO intake   -RD to continue to monitor  NUTRITION DIAGNOSIS:   Malnutrition related to chronic illness as evidenced by severe depletion of body fat, severe depletion of muscle mass.  GOAL:   Patient will meet greater than or equal to 90% of their needs  MONITOR:   PO intake, Supplement acceptance  REASON FOR ASSESSMENT:   Low Braden    ASSESSMENT:   81 year old male with history of dementia,diastolic HF,BPH, atrial flutter ( off AC due to falls),colon cancer who presented to the Ed due to bleeding on decub ulcer site post debridement.He has no complaints but can't provide much history. History was taken from ER. He has a stage III decub ulcer on left buttock but pus was noted near urethral opening with urine cath showing pus post in/out lavage. He was found to have mild leukocytosis with UA suspicious of UTI.  Pt needs current weight obtained.  Pt in room with no family present. Pt confused with questions and was not able to provide much history. Pt states he did eat breakfast but it "something small". Pt unable to provide UBW.  Pt with increased protein needs d/t multiple decubitus ulcers. RD to order supplements and daily multivitamin to provide additional protein for wound healing.  Nutrition-Focused physical exam completed. Findings are severe fat depletion, severe muscle depletion, and mild edema.   Labs reviewed: Elevated BUN, Creatinine  Diet Order:  Diet regular Room service appropriate?: Yes; Fluid consistency:: Thin  Skin:     Last BM:  10/10  Height:   Ht Readings from Last 1 Encounters:  04/07/15 5\' 10"  (1.778 m)     Weight:   Wt Readings from Last 1 Encounters:  01/20/15 140 lb (63.504 kg)    Ideal Body Weight:  75.5 kg  BMI:  There is no weight on file to calculate BMI.  Estimated Nutritional Needs:   Kcal:   unable to calculate  Protein:     Fluid:     EDUCATION NEEDS:   No education needs identified at this time  Clayton Bibles, MS, RD, LDN Pager: [phone] After Hours Pager: [phone]

## 2015-04-07 NOTE — Clinical Social Work Note (Cosign Needed)
Clinical Social Work Assessment  Patient Details  Name: Kenneth Edwards MRN: [id number] Date of Birth: 17-Jul-1919  Date of referral:  04/07/15               Reason for consult:  Discharge Planning                Permission sought to share information with:  Family Supports, Customer service manager Permission granted to share information::  Yes, Verbal Permission Granted  Name::     Vergie Living  Agency::  [facility] [facility]  Relationship::  Son  Sport and exercise psychologist Information:     Housing/Transportation Living arrangements for the past 2 months:  [place] of Information:  Adult Children Patient Interpreter Needed:  None Criminal Activity/Legal Involvement Pertinent to Current Situation/Hospitalization:  No - Comment as needed Significant Relationships:  Adult Children, Siblings Lives with:  Facility Resident Do you feel safe going back to the place where you live?  Yes Need for family participation in patient care:  No (Coment)  Care giving concerns:  Pt admitted from [place]. Pt son did not identify any care giving concerns.   Social Worker assessment / plan:  Pt admitted from [place]. BSW Intern met with pt and pt son at bedside.  Pt and pt son agreeable for pt to return to [facility] when medically ready.  BSW Intern contacted [facility] and confirmed facility can accept pt back. Per [place] was following at [facility] and pt private pay at [facility].  BSW Intern completed FL2 and sent clinicals to [facility].  BSW Intern will continue to follow.  Employment status:  Retired Forensic scientist:  Medicare PT Recommendations:  [place] / Referral to community resources:  [place]  Patient/Family's Response to care:  Pt alert and oriented x4. However, hard of hearing which made it difficult for pt to participate in assessment. Pt and pt son coping appropriately.    Patient/Family's Understanding of and Emotional Response to Diagnosis, Current Treatment, and Prognosis:  Pt and pt son understanding of treatment plan.  Emotional Assessment Appearance:  Appears stated age Attitude/Demeanor/Rapport:  Other (Appropriate) Affect (typically observed):  Appropriate Orientation:  Oriented to Self, Oriented to Place, Oriented to  Time, Oriented to Situation Alcohol / Substance use:  Not Applicable Psych involvement (Current and /or in the community):  No (Comment)  Discharge Needs  Concerns to be addressed:  Discharge Planning Concerns Readmission within the last 30 days:  No Current discharge risk:  None Barriers to Discharge:  Continued Medical Work up   [employer], Student-SW 04/07/2015, 2:33 PM

## 2015-04-07 NOTE — Progress Notes (Signed)
[facility] PROGRESS NOTE  Kenneth Edwards CNO:[id number] DOB: [birth year] DOA: 04/06/2015 PCP: Kenneth Duos, MD  Assessment/Plan: 1-UTI: no hematuria, no fever, no tachycardia. Patient is non-toxic in appearance. Do not meet criteria for Sepsis. -will continue gentle IVF's and IV antibiotics -follow culture data -no febrile and WBC's improved/resolving   2-anemia of chronic disease: with acute blood loss component from decubitus wound debridement  -will follow Hgb trend -no need for transfusion currently -decubitus ulcer is just oozing currently -will start niferex  3-dementia with behavioral changes: -stable currently -will continue depakote, lexapro, trazodone and PRN ativan  4-GERD: will continue PPI  5-acute on chronic renal failure: stage 3 at baseline -due to UTI and dehydration most likely -much better with IVF's and abx's -will follow trend  6-stage 3 decubitus ulcer: will follow wound care rec's  7-PVD: with left great toe gangrene -not a candidate for intervention -no signs of superimposed infection -will monitor -patient denies any pain  8-chronic diastolic HF: -will be cautious with fluid resuscitation -no signs of fluid overload currently -continue lasix -patient last EF 50-55% -will follow daily weights and strict intake and Output  9-BPH: continue flomax  Code Status: DNR  Family Communication: no family at bedside Disposition Plan: back to SNF in 1-2 days base on clinical improvement; continue IV antibiotics and  follow culture data  Consultants:  Wound care consult   Surgery was consulted by admitting physician   Procedures:  See below for x-ray reports   Antibiotics:  Rocephin 10/10  HPI/Subjective: Pleasantly confused, no CP, no SOB and currently afebrile. No nausea, no vomiting and denies abd pain.  Objective: Filed Vitals:   04/07/15 1249  BP: 149/43  Pulse: 53  Temp: 97.9 F (36.6 C)  Resp: 16    Intake/Output  Summary (Last 24 hours) at 04/07/15 1454 Last data filed at 04/07/15 0645  Gross per 24 hour  Intake    905 ml  Output      0 ml  Net    905 ml   There were no vitals filed for this visit.  Exam:   General:  Afebrile, pleasantly confused; no CP, no SOB. Patient gangrenous changes on his left great toe, but no suppuration or signs of superimposed infection.   Cardiovascular: S1 and S2, no rubs or gallops  Respiratory: no wheezing, no rales; good air movement   Abdomen: soft, NT, ND, positive BS  Musculoskeletal: no edema, pressure ulcers on his heels bilaterally, dry gangrene on his left great toe; no purulent drainage appreciated. Decrease peripheral pulses bilaterally  Skin: stage 3 decubitus ulcer, status post debridement, no odor, positive serosanguineous oozing   Data Reviewed: Basic Metabolic Panel:  Recent Labs Lab 04/06/15 1735 04/07/15 0525  NA 135 138  K 4.1 4.1  CL 97* 102  CO2 27 28  GLUCOSE 122* 114*  BUN 89* 77*  CREATININE 1.71* 1.40*  CALCIUM 8.5* 8.4*   Liver Function Tests:  Recent Labs Lab 04/07/15 0525  AST 11*  ALT 9*  ALKPHOS 53  BILITOT 0.5  PROT 6.2*  ALBUMIN 2.2*   CBC:  Recent Labs Lab 04/06/15 1735 04/07/15 0525  WBC 12.3* 9.4  NEUTROABS 9.8* 6.5  HGB 9.9* 9.5*  HCT 31.2* 30.2*  MCV 87.6 87.3  PLT 426* 396   BNP (last 3 results)  Recent Labs  11/01/14 1038  BNP 365.9*   CBG: No results for input(s): GLUCAP in the last 168 hours.  Recent Results (from the past 240 hour(s))  Urine culture     Status: None (Preliminary result)   Collection Time: 04/06/15  5:24 PM  Result Value Ref Range Status   Specimen Description URINE, CATHETERIZED  Final   Special Requests Normal  Final   Culture   Final    TOO YOUNG TO READ Performed at [facility]    Report Status PENDING  Incomplete     Studies: No results found.  Scheduled Meds: . cefTRIAXone (ROCEPHIN)  IV  1 g Intravenous Q24H  . divalproex  125 mg  Oral Daily  . escitalopram  10 mg Oral Daily  . feeding supplement (ENSURE ENLIVE)  237 mL Oral BID BM  . feeding supplement (PRO-STAT SUGAR FREE 64)  30 mL Oral Daily  . furosemide  40 mg Oral BID  . multivitamin with minerals  1 tablet Oral Daily  . pantoprazole  40 mg Oral Daily  . tamsulosin  0.4 mg Oral Daily  . traZODone  50 mg Oral QHS   Continuous Infusions: . sodium chloride 100 mL/hr at 04/07/15 0054     Time spent: 30 minutes    Kenneth Edwards  [facility] Pager [phone]. If 7PM-7AM, please contact night-coverage at [email or web address], password [facility] 04/07/2015, 2:54 PM  LOS: 1 day

## 2015-04-08 DIAGNOSIS — Z515 Encounter for palliative care: Secondary | ICD-10-CM

## 2015-04-08 DIAGNOSIS — N390 Urinary tract infection, site not specified: Secondary | ICD-10-CM

## 2015-04-08 DIAGNOSIS — L8990 Pressure ulcer of unspecified site, unspecified stage: Secondary | ICD-10-CM

## 2015-04-08 LAB — CBC
HEMATOCRIT: 26.1 % — AB (ref 39.0–52.0)
Hemoglobin: 8.2 g/dL — ABNORMAL LOW (ref 13.0–17.0)
MCH: 27.6 pg (ref 26.0–34.0)
MCHC: 31.4 g/dL (ref 30.0–36.0)
MCV: 87.9 fL (ref 78.0–100.0)
PLATELETS: 382 10*3/uL (ref 150–400)
RBC: 2.97 MIL/uL — ABNORMAL LOW (ref 4.22–5.81)
RDW: 17.6 % — AB (ref 11.5–15.5)
WBC: 8.4 10*3/uL (ref 4.0–10.5)

## 2015-04-08 LAB — BASIC METABOLIC PANEL
Anion gap: 7 (ref 5–15)
BUN: 64 mg/dL — AB (ref 6–20)
CALCIUM: 8.3 mg/dL — AB (ref 8.9–10.3)
CHLORIDE: 104 mmol/L (ref 101–111)
CO2: 28 mmol/L (ref 22–32)
Creatinine, Ser: 1.39 mg/dL — ABNORMAL HIGH (ref 0.61–1.24)
GFR calc Af Amer: 49 mL/min — ABNORMAL LOW (ref >60)
GFR, EST NON AFRICAN AMERICAN: 42 mL/min — AB (ref >60)
Glucose, Bld: 115 mg/dL — ABNORMAL HIGH (ref 65–99)
POTASSIUM: 4.3 mmol/L (ref 3.5–5.1)
SODIUM: 139 mmol/L (ref 135–145)

## 2015-04-08 LAB — IRON AND TIBC
IRON: 30 ug/dL — AB (ref 45–182)
Saturation Ratios: 15 % — ABNORMAL LOW (ref 17.9–39.5)
TIBC: 200 ug/dL — ABNORMAL LOW (ref 250–450)
UIBC: 170 ug/dL

## 2015-04-08 LAB — FERRITIN: Ferritin: 314 ng/mL (ref 24–336)

## 2015-04-08 LAB — RETICULOCYTES
RBC.: 3.26 MIL/uL — AB (ref 4.22–5.81)
RETIC COUNT ABSOLUTE: 65.2 10*3/uL (ref 19.0–186.0)
Retic Ct Pct: 2 % (ref 0.4–3.1)

## 2015-04-08 LAB — VITAMIN B12: VITAMIN B 12: 657 pg/mL (ref 180–914)

## 2015-04-08 LAB — FOLATE: FOLATE: 10.5 ng/mL (ref >5.9)

## 2015-04-08 MED ORDER — COLLAGENASE 250 UNIT/GM EX OINT
TOPICAL_OINTMENT | Freq: Every day | CUTANEOUS | Status: DC
  Filled 2015-04-08: qty 30

## 2015-04-08 MED ORDER — HYDROCODONE-ACETAMINOPHEN 5-325 MG PO TABS: 1.0000 | ORAL_TABLET | Freq: Four times a day (QID) | ORAL | Status: DC | PRN

## 2015-04-08 MED ORDER — TRAMADOL HCL 50 MG PO TABS
50.0000 mg | ORAL_TABLET | Freq: Four times a day (QID) | ORAL | Status: DC | PRN
  Administered 2015-04-08: 50 mg via ORAL
  Filled 2015-04-08: qty 1

## 2015-04-08 NOTE — Progress Notes (Signed)
[facility] PROGRESS NOTE  Kenneth Edwards NOM:[id number] DOB: [birth year] DOA: 04/06/2015 PCP: Hennie Duos, MD Interim summary: Patient is a 81 year old male with history of dementia,diastolic HF,BPH, atrial flutter ( off AC due to falls),colon cancer  presented to the Ed due to bleeding on decub ulcer site post debridement, and was foundt o have pus with in and out cath. He was admitted for urinary tract infection.   Assessment/Plan: 1-UTI: no hematuria, no fever, no tachycardia. Patient is non-toxic in appearance. Do not meet criteria for Sepsis. -will continue  IV antibiotics, urine cultures show multiple bacterial morphotypes.  - repeat urine culture ordered.  -no febrile and WBC's improved/resolving   2-anemia of chronic disease: with acute blood loss component from decubitus wound debridement  - drop of 1 gm of hemoglobin from admission. Baseline hemoglobin is around 12, and dropped to 9 on admission and further down to around 8 today.  -no need for transfusion currently -decubitus ulcer is just oozing currently -will start niferex. - ordered anemia panel.    3-dementia with behavioral changes: -stable currently -will continue depakote, lexapro, trazodone and PRN ativan  4-GERD: will continue PPI  5-acute on chronic renal failure: stage 3 at baseline -due to UTI and dehydration most likely -much better with IVF's and abx's -will follow trend  6-stage 3 decubitus ulcer: will follow wound care rec's  7-PVD: with left great toe gangrene -not a candidate for intervention -no signs of superimposed infection -will monitor -patient denies any pain  8-chronic diastolic HF: -will be cautious with fluid resuscitation -no signs of fluid overload currently -continue lasix -patient last EF 50-55% -will follow daily weights and strict intake and Output  9-BPH: continue flomax  10. Disposition: pt from SNF. Plan to get palliative care consult, in view of all the co  morbidities, for goals of care.   Code Status: DNR  Family Communication: no family at bedside Disposition Plan: back to SNF in 1-2 days base on clinical improvement; continue IV antibiotics and  follow culture data  Consultants:  Wound care consult   Surgery was consulted by admitting physician   Procedures:  See below for x-ray reports   Antibiotics:  Rocephin 10/10  HPI/Subjective: Pleasantly confused, no CP, no SOB and currently afebrile. No nausea, no vomiting and denies abd pain. Reports some leg pain intermittently and has refused any pain meds for the pain. Wants to know when he can go back to SNF.   Objective: Filed Vitals:   04/08/15 0541  BP: 104/48  Pulse: 55  Temp: 97.6 F (36.4 C)  Resp: 16    Intake/Output Summary (Last 24 hours) at 04/08/15 0945 Last data filed at 04/08/15 0600  Gross per 24 hour  Intake    900 ml  Output      0 ml  Net    900 ml   There were no vitals filed for this visit.  Exam:   General:  Afebrile, pleasantly confused; no CP, no SOB. Patient gangrenous changes on his left great toe, but no suppuration or signs of superimposed infection.   Cardiovascular: S1 and S2, no rubs or gallops  Respiratory: no wheezing, no rales; good air movement   Abdomen: soft, NT, ND, positive BS  Musculoskeletal: no edema, pressure ulcers on his heels bilaterally, dry gangrene on his left great toe; no purulent drainage appreciated. Decrease peripheral pulses bilaterally  Skin: stage 3 decubitus ulcer, status post debridement, no odor, positive serosanguineous oozing   Data Reviewed:  Basic Metabolic Panel:  Recent Labs Lab 04/06/15 1735 04/07/15 0525 04/08/15 0410  NA 135 138 139  K 4.1 4.1 4.3  CL 97* 102 104  CO2 27 28 28   GLUCOSE 122* 114* 115*  BUN 89* 77* 64*  CREATININE 1.71* 1.40* 1.39*  CALCIUM 8.5* 8.4* 8.3*   Liver Function Tests:  Recent Labs Lab 04/07/15 0525  AST 11*  ALT 9*  ALKPHOS 53  BILITOT 0.5  PROT  6.2*  ALBUMIN 2.2*   CBC:  Recent Labs Lab 04/06/15 1735 04/07/15 0525 04/08/15 0410  WBC 12.3* 9.4 8.4  NEUTROABS 9.8* 6.5  --   HGB 9.9* 9.5* 8.2*  HCT 31.2* 30.2* 26.1*  MCV 87.6 87.3 87.9  PLT 426* 396 382   BNP (last 3 results)  Recent Labs  11/01/14 1038  BNP 365.9*   CBG: No results for input(s): GLUCAP in the last 168 hours.  Recent Results (from the past 240 hour(s))  Urine culture     Status: None   Collection Time: 04/06/15  5:24 PM  Result Value Ref Range Status   Specimen Description URINE, CATHETERIZED  Final   Special Requests Normal  Final   Culture   Final    MULTIPLE SPECIES PRESENT, SUGGEST RECOLLECTION Performed at [facility]    Report Status 04/07/2015 FINAL  Final     Studies: No results found.  Scheduled Meds: . cefTRIAXone (ROCEPHIN)  IV  1 g Intravenous Q24H  . divalproex  125 mg Oral Daily  . escitalopram  10 mg Oral Daily  . feeding supplement (ENSURE ENLIVE)  237 mL Oral BID BM  . feeding supplement (PRO-STAT SUGAR FREE 64)  30 mL Oral Daily  . furosemide  40 mg Oral BID  . iron polysaccharides  150 mg Oral Daily  . multivitamin with minerals  1 tablet Oral Daily  . pantoprazole  40 mg Oral Daily  . tamsulosin  0.4 mg Oral Daily  . traZODone  50 mg Oral QHS   Continuous Infusions:     Time spent: 30 minutes    [place] [facility] Pager [phone]. If 7PM-7AM, please contact night-coverage at [email or web address], password [facility] 04/08/2015, 9:45 AM  LOS: 2 days

## 2015-04-08 NOTE — Care Management Important Message (Signed)
Important Message  Patient Details  Name: Kenneth Edwards MRN: [id number] Date of Birth: [birth year]   Medicare Important Message Given:  Yes-second notification given    Camillo Flaming 04/08/2015, 11:21 AMImportant Message  Patient Details  Name: Kenneth Edwards MRN: [id number] Date of Birth: Jul 08, 1919   Medicare Important Message Given:  Yes-second notification given    Camillo Flaming 04/08/2015, 11:21 AM

## 2015-04-08 NOTE — Clinical Documentation Improvement (Signed)
Hospitalist   (if agreed, please document your response in the progress notes and discharge summary, not on the query form itself.)  Possible Clinical Conditions:  - Severe malnutrition in the context of chronic illness  - Other type of malnutrition  - Other condition  - Unable to clinically determine  Clinical Information: Registered Dietician Assessment completed 04/07/15 at 11:50 am by Carmon Sails: "Malnutrition related to chronic illness as evidenced by severe depletion of body fat, severe depletion of muscle mass."      Please exercise your independent, professional judgment when responding. A specific answer is not anticipated or expected.   Thank You, Erling Conte  RN BSN CCDS [phone] Health Information Management [place]

## 2015-04-08 NOTE — Consult Note (Signed)
Consultation Note Date: 04/08/2015   Patient Name: Kenneth Edwards  DOB: 29-May-1920  MRN: [id number]  Age / Sex: 81 y.o., male   PCP: Hennie Duos, MD Referring Physician: Hosie Poisson, MD  Reason for Consultation: [place] Assessment and Plan Summary of Established Goals of Care and Medical Treatment Preferences    Palliative Care Discussion Held Today:   I had a long discussion today with Kenneth Edwards (although he is very confused, hard of hearing, and did not participate much in conversation) and daughter Kenneth Edwards. I also spoke briefly with son Kenneth Edwards over the phone. Kenneth Edwards tells me that her father went to [facility] in May from hospital discharge (his wife died and was buried at this same time). Kenneth Edwards says that he is much more confused than his baseline as he is usually able to have a rationale conversation and even discuss current news events. However he is mostly bed bound but gets to wheelchair occasionally. He is incontinent of urine/stool and has chronic neuropathic bilateral leg/foot pain.   We discussed his QOL and he is miserable and has lost his dignity and independence. She says that he is ready to die and he has told them this. They do not want aggressive care. They have big concerns over his hospice care at [facility] has called the hospice agency and is worried as they cannot figure out when they have come to help with [name], questioning what they have done to help, and feel the communication is poor. I have spoken with them and explained that there are numerous hospice agencies and they DO want hospice and are interested in changing agencies.   I have given Kenneth Edwards a MOST form and she says that they completed one for her mother and may have had one completed for her father. She will discuss further with her brother but they do seem interested in completing. I will follow up tomorrow.    Contacts/Participants in Discussion: Primary Decision Maker:  Daughter and son  Goals of Care/Code Status/Advance Care Planning:   Code Status: DNR  Artificial feeding: NO  Surgery: NO    Symptom Management:   Pain: Chronic is his legs. Family wishes to continue tramadol as he has tolerated this for months now getting 50 mg TID. I have ordered prn.   Psycho-social/Spiritual:   Support System: Children are very supportive.   Prognosis: < 6 months  Discharge Planning:  Return to SNF with hospice.        Chief Complaint/HPI: 81 yo male with PMH dementia, decubitus ulcers, diastolic heart failure, atrial flutter, colon cancer (resection > 30 yrs ago), BPH. To ED with bleeding post debridement of wound when purulent urine - admitted to treat UTI.   Primary Diagnoses  Present on Admission:  . Urinary tract infection . Sepsis ([place])  Palliative Review of Systems:   + bilateral leg/foot chronic neuropathic pain   I have reviewed the medical record, interviewed the patient and family, and examined the patient. The following aspects are pertinent.  Past Medical History  Diagnosis Date  . Essential hypertension   . Colon cancer ([place])   . Atrial flutter ([place])   . Dementia   . BPH (benign prostatic hyperplasia)   . Diastolic heart failure ([place])   . Osteomyelitis of ankle and foot [facility])    Social History   Social History  . Marital Status: Married    Spouse Name: N/A  . Number of Children: N/A  .  Years of Education: N/A   Social History Main Topics  . Smoking status: Never Smoker   . Smokeless tobacco: Never Used  . Alcohol Use: No  . Drug Use: No  . Sexual Activity: Not Asked   Other Topics Concern  . None   Social History Narrative   Family History  Problem Relation Age of Onset  . Family history unknown: Yes   Scheduled Meds: . cefTRIAXone (ROCEPHIN)  IV  1 g Intravenous Q24H  . collagenase   Topical Daily  . divalproex  125 mg Oral Daily  . escitalopram  10 mg Oral Daily  . feeding supplement (ENSURE ENLIVE)   237 mL Oral BID BM  . feeding supplement (PRO-STAT SUGAR FREE 64)  30 mL Oral Daily  . furosemide  40 mg Oral BID  . iron polysaccharides  150 mg Oral Daily  . multivitamin with minerals  1 tablet Oral Daily  . pantoprazole  40 mg Oral Daily  . tamsulosin  0.4 mg Oral Daily  . traZODone  50 mg Oral QHS   Continuous Infusions:  PRN Meds:.acetaminophen, HYDROcodone-acetaminophen, LORazepam, traMADol Medications Prior to Admission:  Prior to Admission medications   Medication Sig Start Date End Date Taking? Authorizing Provider  acetaminophen (TYLENOL) 500 MG tablet Take 500 mg by mouth every 6 (six) hours as needed for mild pain.   Yes Historical Provider, MD  aspirin EC 81 MG EC tablet Take 1 tablet (81 mg total) by mouth daily. 11/03/14  Yes [name], MD  escitalopram (LEXAPRO) 10 MG tablet Take 10 mg by mouth daily.   Yes Historical Provider, MD  furosemide (LASIX) 20 MG tablet Take 3 tablets (60 mg total) by mouth 2 (two) times daily. Patient taking differently: Take 40 mg by mouth 2 (two) times daily.  11/06/14  Yes [name], MD  HYDROcodone-acetaminophen (NORCO/VICODIN) 5-325 MG per tablet Take one tablet by mouth every 6 hours for pain. Hold for somnolence/drowiness 03/19/15  Yes Tiffany L Reed, DO  LORazepam (ATIVAN) 0.5 MG tablet Take 1 tablet (0.5 mg total) by mouth every 8 (eight) hours as needed for anxiety. 11/03/14  Yes [name], MD  Multiple Vitamins-Minerals (MULTIVITAMIN WITH MINERALS) tablet Take 1 tablet by mouth daily.   Yes Historical Provider, MD  Nutritional Supplements (JUVEN REVIGOR) PACK Take 1 packet by mouth 2 (two) times daily.   Yes Historical Provider, MD  pantoprazole (PROTONIX) 40 MG tablet Take 1 tablet (40 mg total) by mouth daily. 11/03/14  Yes [name], MD  potassium chloride (KLOR-CON) 20 MEQ packet Take 20 mEq by mouth once.   Yes Historical Provider, MD  tamsulosin (FLOMAX) 0.4 MG CAPS capsule Take 0.4 mg by mouth daily.    Yes Historical  Provider, MD  traMADol (ULTRAM) 50 MG tablet Take 1 tablet (50 mg total) by mouth every 6 (six) hours as needed. Take 1-2 tablets by mouth every 8 hours for pain Patient taking differently: Take 50 mg by mouth every 6 (six) hours as needed.  11/03/14  Yes [name], MD  traZODone (DESYREL) 50 MG tablet Take 50 mg by mouth at bedtime.   Yes Historical Provider, MD  Vitamin D, Ergocalciferol, (DRISDOL) 50000 UNITS CAPS capsule Take 50,000 Units by mouth every 7 (seven) days.   Yes Historical Provider, MD  divalproex (DEPAKOTE SPRINKLE) 125 MG capsule Take 125 mg by mouth daily.    Historical Provider, MD   No Known Allergies CBC:    Component Value Date/Time   WBC 8.4  04/08/2015 0410   HGB 8.2* 04/08/2015 0410   HCT 26.1* 04/08/2015 0410   PLT 382 04/08/2015 0410   MCV 87.9 04/08/2015 0410   NEUTROABS 6.5 04/07/2015 0525   LYMPHSABS 1.3 04/07/2015 0525   MONOABS 1.5* 04/07/2015 0525   EOSABS 0.1 04/07/2015 0525   BASOSABS 0.0 04/07/2015 0525   Comprehensive Metabolic Panel:    Component Value Date/Time   NA 139 04/08/2015 0410   K 4.3 04/08/2015 0410   CL 104 04/08/2015 0410   CO2 28 04/08/2015 0410   BUN 64* 04/08/2015 0410   CREATININE 1.39* 04/08/2015 0410   GLUCOSE 115* 04/08/2015 0410   CALCIUM 8.3* 04/08/2015 0410   AST 11* 04/07/2015 0525   ALT 9* 04/07/2015 0525   ALKPHOS 53 04/07/2015 0525   BILITOT 0.5 04/07/2015 0525   PROT 6.2* 04/07/2015 0525   ALBUMIN 2.2* 04/07/2015 0525    Physical Exam:  Vital Signs: BP 127/52 mmHg  Pulse 60  Temp(Src) 97.5 F (36.4 C) (Oral)  Resp 18  Ht 5\' 10"  (1.778 m)  SpO2 100% SpO2: SpO2: 100 % O2 Device: O2 Device: Nasal Cannula O2 Flow Rate: O2 Flow Rate (L/min): 3 L/min Intake/output summary:  Intake/Output Summary (Last 24 hours) at 04/08/15 1928 Last data filed at 04/08/15 1458  Gross per 24 hour  Intake   1380 ml  Output    700 ml  Net    680 ml   LBM: Last BM Date: 04/07/15 Baseline Weight:   Most recent  weight:    Exam Findings:   General: NAD, lying in bed HEENT: [place]/AT CVS: RRR Resp: No labored breathing Abd: Soft, NT, ND Extrem: Bilat feet in boots, gray/blue discoloration below knees, Lt great toe black necrotic banded Neuro: Awake, alert, oriented to person but otherwise confused           Palliative Performance Scale: 30%                Additional Data Reviewed: Recent Labs     04/07/15  0525  04/08/15  0410  WBC  9.4  8.4  HGB  9.5*  8.2*  PLT  396  382  NA  138  139  BUN  77*  64*  CREATININE  1.40*  1.39*     Time In: 1415 Time Out: 1615 Time Total: 156min  Greater than 50%  of this time was spent counseling and coordinating care related to the above assessment and plan.  Signed by:  Vinie Sill, NP Palliative Medicine Team Pager # [phone] (M-F 8a-5p) Team Phone # [phone] (Nights/Weekends)

## 2015-04-08 NOTE — Consult Note (Signed)
WOC wound consult note Reason for Consult: Pressure injuries (chronic, non-healing) Wound type:pressure  Pressure Ulcer POA: Yes Measurement:  Left IT:  4cm x 3.5cm with depth obscured by the presence of necrotic tissue (eschar). Serous exudate, moderate amount, strong odor consistent with necrotic tissue.  Bilateral upper buttocks: left: 1.5cm x 1cm x 0.2cm and right: 1cm x 1cm x 0.2cm. No drainage, no odor.  Left foot, great toe: 1.5cm band of eschar/gangrene-no drainage, stable.   Left heel: 9cm x 6.5cm x 0.4cm with 20% necrotic tissue at most proximal end. Wound bed is pink, moist, non-granulating. Moderate amount light yellow drainage, musty odor. Old dressing adhering to tissue.   Right heel: 4.5cm x 3cm x 0.4cm with pink, smooth, non-granulating base. Moderate amount light yellow drainage; old dressing adherent to wound bed. Wound bed:As described above. Drainage (amount, consistency, odor) As described above. Periwound: Intact, dry. Dressing procedure/placement/frequency: I suggest PT for hydrotherapy to the left IT while he is an inpatient; hopefully we can continue the mechanical debridement/enzymatic debridement process and reveal a clean wound bed.  A therapeutic mattress with low air loss feature is provided as well as a pressure redistribution chair pad for his use when OOB in chair. Pt. Already has bilateral pressure redistribution heel boots and we will continue with those while here. Conservative care to the bilateral heel pressure ulcers so that future dressings do not adhere is provided. [place] nursing team will not follow, but will remain available to this patient, the nursing and medical teams.  Please re-consult if needed. Thanks, Maudie Flakes, MSN, RN, [place], [place], [place] [phone])

## 2015-04-08 NOTE — Care Management Note (Signed)
Case Management Note  Patient Details  Name: Kenneth Edwards MRN: [id number] Date of Birth: 04/11/1920  Subjective/Objective:     81 yo admitted with UTI and bleeding decub               Action/Plan: [facility]  Expected Discharge Date:   (unknown)               Expected Discharge Plan:  Skilled Nursing Facility  In-House Referral:  Clinical Social Work  Discharge planning Services  CM Consult  Post Acute Care Choice:    Choice offered to:     DME Arranged:    DME Agency:     HH Arranged:    [place]  Status of Service:  In process, will continue to follow  Medicare Important Message Given:  Yes-second notification given Date Medicare IM Given:    Medicare IM give by:    Date Additional Medicare IM Given:    Additional Medicare Important Message give by:     If discussed at [place] of Stay Meetings, dates discussed:    Additional Comments: This CM alerted [facility] rep that pt is in the hospital. No other CM needs communicated at this time. Lynnell Catalan, RN 04/08/2015, 4:00 PM

## 2015-04-09 ENCOUNTER — Encounter (HOSPITAL_COMMUNITY): Payer: Self-pay | Admitting: Student

## 2015-04-09 LAB — BASIC METABOLIC PANEL
ANION GAP: 7 (ref 5–15)
BUN: 54 mg/dL — AB (ref 6–20)
CHLORIDE: 102 mmol/L (ref 101–111)
CO2: 25 mmol/L (ref 22–32)
Calcium: 8 mg/dL — ABNORMAL LOW (ref 8.9–10.3)
Creatinine, Ser: 1.52 mg/dL — ABNORMAL HIGH (ref 0.61–1.24)
GFR calc Af Amer: 44 mL/min — ABNORMAL LOW (ref >60)
GFR, EST NON AFRICAN AMERICAN: 38 mL/min — AB (ref >60)
GLUCOSE: 117 mg/dL — AB (ref 65–99)
POTASSIUM: 3.9 mmol/L (ref 3.5–5.1)
Sodium: 134 mmol/L — ABNORMAL LOW (ref 135–145)

## 2015-04-09 LAB — CBC
HEMATOCRIT: 26.7 % — AB (ref 39.0–52.0)
HEMOGLOBIN: 8.2 g/dL — AB (ref 13.0–17.0)
MCH: 27.2 pg (ref 26.0–34.0)
MCHC: 30.7 g/dL (ref 30.0–36.0)
MCV: 88.7 fL (ref 78.0–100.0)
PLATELETS: 347 10*3/uL (ref 150–400)
RBC: 3.01 MIL/uL — AB (ref 4.22–5.81)
RDW: 17.7 % — ABNORMAL HIGH (ref 11.5–15.5)
WBC: 10.4 10*3/uL (ref 4.0–10.5)

## 2015-04-09 NOTE — Progress Notes (Signed)
I have attempted to reach daughter, [name], who I spoke with yesterday x 2 but no answer and no family at bedside when I stopped by earlier today. I have left a message requesting a return phone call to follow up on our conversation yesterday and MOST form.   Vinie Sill, NP Palliative Medicine Team Pager # [phone] (M-F 8a-5p) Team Phone # [phone] (Nights/Weekends)

## 2015-04-09 NOTE — Progress Notes (Signed)
CSW continuing to follow.   Pt admitted from [facility] and pt family plans for pt to return to [facility] when medically ready for discharge.  CSW was notified that pt family did not wish to continue [facility]. CSW notified RNCM. Per [employer], the other hospice that facility is contracted with it [facility]. RNCM spoke with pt son and pt son agreeable to [facility]. RNCM notified [facility] and [facility] stated that [facility] would have to place order to [facility] when pt returns. CSW notified [place] and [employer] agreeable to getting referral to [facility] when pt returns.   Per MD, pt not yet medically ready for discharge.  CSW to continue to follow to provide support and assist with pt return to [facility] when medically ready for discharge.  Alison Murray, MSW, [place] Work [phone]

## 2015-04-09 NOTE — Progress Notes (Signed)
[facility] PROGRESS NOTE  Kenneth Edwards KGM:[id number] DOB: February 23, 1920 DOA: 04/06/2015 PCP: Hennie Duos, MD Interim summary: Patient is a 81 year old male with history of dementia,diastolic HF,BPH, atrial flutter ( off AC due to falls),colon cancer  presented to the Ed due to bleeding on decub ulcer site post debridement, and was foundt o have pus with in and out cath. He was admitted for urinary tract infection.   Assessment/Plan: 1-UTI: no hematuria, no fever, no tachycardia. Patient is non-toxic in appearance. Do not meet criteria for Sepsis. -will continue  IV antibiotics, urine cultures show multiple bacterial morphotypes.  - repeat urine culture ordered and grew 80,000 enterococcus, sensitivities are pending.  -no febrile and WBC's improved/resolving   2-anemia of chronic disease: with acute blood loss component from decubitus wound debridement  - drop of 1 gm of hemoglobin from admission. Baseline hemoglobin is around 12, and dropped to 9 on admission and further down to around 8 today. Stabilized around 8.  -no need for transfusion currently -decubitus ulcer is just oozing currently -will start niferex. - ordered anemia panel, showed low iron levels and low saturation ratio's. Ferritin 314.    3-dementia with behavioral changes: -stable currently -will continue depakote, lexapro, trazodone and PRN ativan  4-GERD: will continue PPI  5-acute on chronic renal failure: stage 3 at baseline -due to UTI and dehydration most likely -much better with IVF's and abx's -will follow trend  6-stage 3 decubitus ulcer: will follow wound care rec's. Hydrotherapy while in patient, but family feels its too aggressive and wanted to focus on comfort care.   7-PVD: with left great toe gangrene -not a candidate for intervention -no signs of superimposed infection -will monitor -patient denies any pain  8-chronic diastolic HF: -will be cautious with fluid resuscitation -no signs  of fluid overload currently -continue lasix -patient last EF 50-55% -will follow daily weights and strict intake and Output  9-BPH: continue flomax  10. Disposition: pt from SNF. Appreciate palliative care consultation.   Code Status: DNR  Family Communication: no family at bedside Disposition Plan: back to SNF tomorrow.   Consultants:  Wound care consult   Surgery was consulted by admitting physician   Procedures:  See below for x-ray reports   Antibiotics:  Rocephin 10/10  HPI/Subjective: Confused, but cheerful.   Objective: Filed Vitals:   04/09/15 1200  BP: 129/49  Pulse: 54  Temp: 97.8 F (36.6 C)  Resp: 18    Intake/Output Summary (Last 24 hours) at 04/09/15 1849 Last data filed at 04/09/15 0446  Gross per 24 hour  Intake      0 ml  Output   1000 ml  Net  -1000 ml   There were no vitals filed for this visit.  Exam:   General:  Afebrile, pleasantly confused; no CP, no SOB. Patient gangrenous changes on his left great toe, but no suppuration or signs of superimposed infection.   Cardiovascular: S1 and S2, no rubs or gallops  Respiratory: no wheezing, no rales; good air movement   Abdomen: soft, NT, ND, positive BS  Musculoskeletal: no edema, pressure ulcers on his heels bilaterally, dry gangrene on his left great toe; no purulent drainage appreciated. Decrease peripheral pulses bilaterally  Skin: stage 3 decubitus ulcer, status post debridement, no odor, positive serosanguineous oozing   Data Reviewed: Basic Metabolic Panel:  Recent Labs Lab 04/06/15 1735 04/07/15 0525 04/08/15 0410 04/09/15 0347  NA 135 138 139 134*  K 4.1 4.1 4.3 3.9  CL  97* 102 104 102  CO2 27 28 28 25   GLUCOSE 122* 114* 115* 117*  BUN 89* 77* 64* 54*  CREATININE 1.71* 1.40* 1.39* 1.52*  CALCIUM 8.5* 8.4* 8.3* 8.0*   Liver Function Tests:  Recent Labs Lab 04/07/15 0525  AST 11*  ALT 9*  ALKPHOS 53  BILITOT 0.5  PROT 6.2*  ALBUMIN 2.2*    CBC:  Recent Labs Lab 04/06/15 1735 04/07/15 0525 04/08/15 0410 04/09/15 0347  WBC 12.3* 9.4 8.4 10.4  NEUTROABS 9.8* 6.5  --   --   HGB 9.9* 9.5* 8.2* 8.2*  HCT 31.2* 30.2* 26.1* 26.7*  MCV 87.6 87.3 87.9 88.7  PLT 426* 396 382 347   BNP (last 3 results)  Recent Labs  11/01/14 1038  BNP 365.9*   CBG: No results for input(s): GLUCAP in the last 168 hours.  Recent Results (from the past 240 hour(s))  Urine culture     Status: None   Collection Time: 04/06/15  5:24 PM  Result Value Ref Range Status   Specimen Description URINE, CATHETERIZED  Final   Special Requests Normal  Final   Culture   Final    MULTIPLE SPECIES PRESENT, SUGGEST RECOLLECTION Performed at [facility]    Report Status 04/07/2015 FINAL  Final  Culture, Urine     Status: None (Preliminary result)   Collection Time: 04/08/15  1:16 PM  Result Value Ref Range Status   Specimen Description URINE, CLEAN CATCH  Final   Special Requests NONE  Final   Culture   Final    80,000 COLONIES/ml ENTEROCOCCUS SPECIES Performed at [facility]    Report Status PENDING  Incomplete     Studies: No results found.  Scheduled Meds: . cefTRIAXone (ROCEPHIN)  IV  1 g Intravenous Q24H  . collagenase   Topical Daily  . divalproex  125 mg Oral Daily  . escitalopram  10 mg Oral Daily  . feeding supplement (ENSURE ENLIVE)  237 mL Oral BID BM  . feeding supplement (PRO-STAT SUGAR FREE 64)  30 mL Oral Daily  . furosemide  40 mg Oral BID  . iron polysaccharides  150 mg Oral Daily  . multivitamin with minerals  1 tablet Oral Daily  . pantoprazole  40 mg Oral Daily  . tamsulosin  0.4 mg Oral Daily  . traZODone  50 mg Oral QHS   Continuous Infusions:     Time spent: 30 minutes    [place] [facility] Pager [phone]. If 7PM-7AM, please contact night-coverage at [email or web address], password [facility] 04/09/2015, 6:49 PM  LOS: 3 days

## 2015-04-09 NOTE — Progress Notes (Signed)
This CM was informed by CSW that pt's family did not want [facility] anymore. I then called pt's son Kenneth Edwards to inquire about situation. Richard states pt has not received the care they were promised by [facility]. [facility] is also contracted with [facility]. Richard informed of this and he asked if I would give referral to them. Richard instructed to contact [facility] to discontinue services with them and I would call [facility]. Per [facility] rep they would need an order from [facility] when pt returns there. They cannot take referral in the hospital. CSW was informed and will alert SNF to send order to [facility] when pt arrives. Son Kenneth Edwards also informed to follow up with [facility] to ensure that the referral is made. No other CM needs communicated at this time. Marney Doctor RN,BSN,NCM  [phone]

## 2015-04-09 NOTE — Progress Notes (Signed)
PT Cancellation Note --D/C PT and HYDRO--  Patient Details Name: Kenneth Edwards MRN: [id number] DOB: 1919/11/30   Cancelled Treatment:    Reason Eval/Treat Not Completed: PT screened, no needs identified, will sign off; spoke with RN (who also spoke with Hospice) regarding pulsed lavage/aggressive wound care; The team is in agreement that aggressive wound care  is not appropriate at this time; Pt is mostly bed bound and confused per chart as well as with significant LE pain--PT will sign off from a Hydrotherapy and  mobility standpoint as well; Thank you.    [facility] 04/09/2015, 10:37 AM

## 2015-04-10 DIAGNOSIS — F0390 Unspecified dementia without behavioral disturbance: Secondary | ICD-10-CM

## 2015-04-10 LAB — URINE CULTURE

## 2015-04-10 MED ORDER — POLYSACCHARIDE IRON COMPLEX 150 MG PO CAPS: 150.0000 mg | ORAL_CAPSULE | Freq: Every day | ORAL | Status: AC

## 2015-04-10 MED ORDER — AMPICILLIN 500 MG PO CAPS: 500.0000 mg | ORAL_CAPSULE | Freq: Three times a day (TID) | ORAL | Status: AC

## 2015-04-10 MED ORDER — TRAMADOL HCL 50 MG PO TABS: 50.0000 mg | ORAL_TABLET | Freq: Four times a day (QID) | ORAL | Status: AC | PRN

## 2015-04-10 MED ORDER — FUROSEMIDE 40 MG PO TABS: 40.0000 mg | ORAL_TABLET | Freq: Two times a day (BID) | ORAL | Status: AC

## 2015-04-10 MED ORDER — COLLAGENASE 250 UNIT/GM EX OINT: TOPICAL_OINTMENT | Freq: Every day | CUTANEOUS | Status: AC

## 2015-04-10 MED ORDER — ENSURE ENLIVE PO LIQD: 237.0000 mL | Freq: Two times a day (BID) | ORAL | Status: AC

## 2015-04-10 MED ORDER — AMPICILLIN 500 MG PO CAPS
500.0000 mg | ORAL_CAPSULE | Freq: Three times a day (TID) | ORAL | Status: DC
  Administered 2015-04-10: 500 mg via ORAL
  Filled 2015-04-10 (×3): qty 1

## 2015-04-10 MED ORDER — HYDROCODONE-ACETAMINOPHEN 5-325 MG PO TABS: ORAL_TABLET | ORAL | Status: AC

## 2015-04-10 NOTE — Discharge Summary (Addendum)
Physician Discharge Summary  Diar Berkel QHU:[id number] DOB: 18-Mar-1920 DOA: 04/06/2015  PCP: Hennie Duos, MD  Admit date: 04/06/2015 Discharge date: 04/10/2015  Time spent: 25 minutes  Recommendations for Outpatient Follow-up:  1. Follow up with hospice MD as recommended.  2. Please continue with dressing changes as recommended by wound care without hydrotherapy.   Discharge Diagnoses:  Active Problems:   Dementia   Venous stasis ulcers of both lower extremities   Urinary tract infection   Sepsis (HCC)   Pressure ulcer   Palliative care encounter   Decubitus ulcer severe malnutrition  Discharge Condition: slightly improved  Diet recommendation: regular  There were no vitals filed for this visit.  History of present illness:  Patient is a 81 year old male with history of dementia,diastolic HF,BPH, atrial flutter ( off AC due to falls),colon cancer presented to the Ed due to bleeding on decub ulcer site post debridement, and was foundt o have pus with in and out cath. He was admitted for urinary tract infection.   Hospital Course:  UTI: no hematuria, no fever, no tachycardia. Patient is non-toxic in appearance. Do not meet criteria for Sepsis. -will continue IV antibiotics, urine cultures show multiple bacterial morphotypes.  - repeat urine culture ordered and grew 80,000 enterococcus, sensitive to ampicillin.  -no febrile and WBC's improved/resolving .  2-anemia of chronic disease: with acute blood loss component from decubitus wound debridement  - drop of 1 gm of hemoglobin from admission. Baseline hemoglobin is around 12, and dropped to 9 on admission and further down to around 8 today. Stabilized around 8.  -no need for transfusion currently -decubitus ulcer is just oozing currently On  niferex. - ordered anemia panel, showed low iron levels and low saturation ratio's. Ferritin 314.    3-dementia with behavioral changes: -stable currently -will  continue depakote, lexapro, trazodone   4-GERD: will continue PPI  5-acute on chronic renal failure: stage 3 at baseline -due to UTI and dehydration most likely Slightly improved   6-stage 3 decubitus ulcer: will follow wound care rec's. Hydrotherapy while in patient, but family feels its too aggressive and wanted to focus on comfort care.   7-PVD: with left great toe gangrene -not a candidate for intervention -no signs of superimposed infection -patient denies any pain  8-chronic diastolic HF: -will be cautious with fluid resuscitation -no signs of fluid overload currently -continue lasix -patient last EF 50-55%   9-BPH: continue flomax  10. Disposition: pt from SNF, plan to d/c to SNF with hospice with focus on comfort care.   Procedures:  none (i.e. Studies not automatically included, echos, thoracentesis, etc; not x-rays)  Consultations:  Wound caer  Palliative care  Physical therapy.   Discharge Exam: Filed Vitals:   04/10/15 0459  BP: 122/50  Pulse: 65  Temp: 97.6 F (36.4 C)  Resp: 20    General: alert afebrile comfortable Cardiovascular: s1s2 Respiratory: diminished at bases.   Discharge Instructions   Discharge Instructions    Discharge instructions    Complete by:  As directed   Please follow up with hospice MD AS recommended.          Current Discharge Medication List    START taking these medications   Details  ampicillin (PRINCIPEN) 500 MG capsule Take 1 capsule (500 mg total) by mouth every 8 (eight) hours. Qty: 21 capsule, Refills: 0    collagenase (SANTYL) ointment Apply topically daily. Qty: 15 g, Refills: 0    feeding supplement, ENSURE ENLIVE, (  ENSURE ENLIVE) LIQD Take 237 mLs by mouth 2 (two) times daily between meals. Qty: 237 mL, Refills: 12    iron polysaccharides (NIFEREX) 150 MG capsule Take 1 capsule (150 mg total) by mouth daily.      CONTINUE these medications which have CHANGED   Details  furosemide (LASIX)  40 MG tablet Take 1 tablet (40 mg total) by mouth 2 (two) times daily. Qty: 30 tablet    HYDROcodone-acetaminophen (NORCO/VICODIN) 5-325 MG tablet Take one tablet by mouth every 6 hours for pain. Hold for somnolence/drowiness Qty: 10 tablet, Refills: 0    traMADol (ULTRAM) 50 MG tablet Take 1 tablet (50 mg total) by mouth every 6 (six) hours as needed. Qty: 30 tablet, Refills: 0      CONTINUE these medications which have NOT CHANGED   Details  acetaminophen (TYLENOL) 500 MG tablet Take 500 mg by mouth every 6 (six) hours as needed for mild pain.    aspirin EC 81 MG EC tablet Take 1 tablet (81 mg total) by mouth daily.    escitalopram (LEXAPRO) 10 MG tablet Take 10 mg by mouth daily.    LORazepam (ATIVAN) 0.5 MG tablet Take 1 tablet (0.5 mg total) by mouth every 8 (eight) hours as needed for anxiety. Qty: 20 tablet, Refills: 0    Multiple Vitamins-Minerals (MULTIVITAMIN WITH MINERALS) tablet Take 1 tablet by mouth daily.    Nutritional Supplements (JUVEN REVIGOR) PACK Take 1 packet by mouth 2 (two) times daily.    pantoprazole (PROTONIX) 40 MG tablet Take 1 tablet (40 mg total) by mouth daily.    tamsulosin (FLOMAX) 0.4 MG CAPS capsule Take 0.4 mg by mouth daily.     traZODone (DESYREL) 50 MG tablet Take 50 mg by mouth at bedtime.    Vitamin D, Ergocalciferol, (DRISDOL) 50000 UNITS CAPS capsule Take 50,000 Units by mouth every 7 (seven) days.    divalproex (DEPAKOTE SPRINKLE) 125 MG capsule Take 125 mg by mouth daily.      STOP taking these medications     potassium chloride (KLOR-CON) 20 MEQ packet        No Known Allergies    The results of significant diagnostics from this hospitalization (including imaging, microbiology, ancillary and laboratory) are listed below for reference.    Significant Diagnostic Studies: No results found.  Microbiology: Recent Results (from the past 240 hour(s))  Urine culture     Status: None   Collection Time: 04/06/15  5:24 PM   Result Value Ref Range Status   Specimen Description URINE, CATHETERIZED  Final   Special Requests Normal  Final   Culture   Final    MULTIPLE SPECIES PRESENT, SUGGEST RECOLLECTION Performed at [facility]    Report Status 04/07/2015 FINAL  Final  Culture, Urine     Status: None   Collection Time: 04/08/15  1:16 PM  Result Value Ref Range Status   Specimen Description URINE, CLEAN CATCH  Final   Special Requests NONE  Final   Culture   Final    80,000 COLONIES/ml ENTEROCOCCUS SPECIES Performed at [facility]    Report Status 04/10/2015 FINAL  Final   Organism ID, Bacteria ENTEROCOCCUS SPECIES  Final      Susceptibility   Enterococcus species - MIC*    AMPICILLIN <=2 SENSITIVE Sensitive     LEVOFLOXACIN >=8 RESISTANT Resistant     NITROFURANTOIN <=16 SENSITIVE Sensitive     VANCOMYCIN 1 SENSITIVE Sensitive     * 80,000 COLONIES/ml ENTEROCOCCUS SPECIES  Labs: Basic Metabolic Panel:  Recent Labs Lab 04/06/15 1735 04/07/15 0525 04/08/15 0410 04/09/15 0347  NA 135 138 139 134*  K 4.1 4.1 4.3 3.9  CL 97* 102 104 102  CO2 27 28 28 25   GLUCOSE 122* 114* 115* 117*  BUN 89* 77* 64* 54*  CREATININE 1.71* 1.40* 1.39* 1.52*  CALCIUM 8.5* 8.4* 8.3* 8.0*   Liver Function Tests:  Recent Labs Lab 04/07/15 0525  AST 11*  ALT 9*  ALKPHOS 53  BILITOT 0.5  PROT 6.2*  ALBUMIN 2.2*   No results for input(s): LIPASE, AMYLASE in the last 168 hours. No results for input(s): AMMONIA in the last 168 hours. CBC:  Recent Labs Lab 04/06/15 1735 04/07/15 0525 04/08/15 0410 04/09/15 0347  WBC 12.3* 9.4 8.4 10.4  NEUTROABS 9.8* 6.5  --   --   HGB 9.9* 9.5* 8.2* 8.2*  HCT 31.2* 30.2* 26.1* 26.7*  MCV 87.6 87.3 87.9 88.7  PLT 426* 396 382 347   Cardiac Enzymes: No results for input(s): CKTOTAL, CKMB, CKMBINDEX, TROPONINI in the last 168 hours. BNP: BNP (last 3 results)  Recent Labs  11/01/14 1038  BNP 365.9*    ProBNP (last 3 results) No results  for input(s): PROBNP in the last 8760 hours.  CBG: No results for input(s): GLUCAP in the last 168 hours.     SignedHosie Poisson  [facility] 04/10/2015, 1:56 PM

## 2015-04-10 NOTE — Progress Notes (Signed)
Pt for discharge to [facility].   CSW facilitated pt discharge needs including contacting facility, faxing pt discharge summary via [facility], discussing with pt son via telephone, providing RN phone number to call report, and arranging ambulance transport for pt to [facility].   No further social work needs identified at this time.  CSW signing off.   Alison Murray, MSW, [place] Work [phone]

## 2015-04-10 NOTE — Progress Notes (Signed)
Report called to Caren Griffins, Therapist, sports at [facility]

## 2015-04-12 ENCOUNTER — Encounter: Payer: Self-pay | Admitting: Internal Medicine

## 2015-04-12 NOTE — Progress Notes (Signed)
Patient ID: Kenneth Edwards, male   DOB: 1919-07-17, 81 y.o.   MRN: [id number] MRN: [id number] Name: Kenneth Edwards  Sex: male Age: 81 y.o. DOB: [birth year]  [place] [facility]: [facility] Facility/Room:405 Level Of Care: SNF Provider: Wille Celeste Emergency Contacts: Extended Emergency Contact Information Primary Emergency Contact: [facility] Address: [address] [place], [place] [address] Johnnette Litter of [place] Phone: [phone] Relation: Daughter Secondary Emergency Contact: Daiva Huge [place] Phone: [phone] Relation: Son  Code Status:DNR   Allergies: Review of patient's allergies indicates no known allergies.  Chief Complaint  Patient presents with  . Acute Visit   acute visit secondary to bleeding from ischial wound  HPI: Patient is 81 y.o. male with history of atrial flutter [place] VASC -3  History of falls hypertension and dementia-he is under hospice services with family's desire for since leak comfort care.  Patient's ischial wound is followed by the wound care physician in the facility-apparently this was debrided earlier today and nursing staff later this afternoon noted that there was bleeding from the site-they have had difficulty stopping the bleeding and I'm following up on this.  Patient's vital signs are stable he does not complain of pain or any distress-- at one point he was on Coumadin but he is no longer on this--he is on low-dose aspirin Patient does have a diagnosis of osteomyelitis of his heels however family wishes conservative care with no active  antibiotic treatment emphasis on comfort  Past Medical History  Diagnosis Date  . Essential hypertension   . Colon cancer ([place])   . Atrial flutter ([place])   . Dementia   . BPH (benign prostatic hyperplasia)   . Diastolic heart failure ([place])   . Osteomyelitis of ankle and foot [facility])     Past Surgical History  Procedure Laterality Date  . Colon surgery      Medications have  been reviewed Include Tylenol 500 mg every 6 hours when necessary.  Tramadol 50 mg every 6 hours when necessary pain.  Aspirin 81 mg daily.  Vitamin D 1000 units daily.  Depakote 125 mg daily.  Protonic 40 mg daily.  Flomax 0.4 mg daily.  Lexapro 10 mg daily.  Lasix 60 mg twice a day.  Ativan 0.5 mg every 8 hours when necessary anxiety      Social History  Substance Use Topics  . Smoking status: Never Smoker   . Smokeless tobacco: Never Used  . Alcohol Use: No    Review of Systems  DATA OBTAINED: from patient, nurse GENERAL:  no fevers, fatigue, appetite changes SKIN: Ischial wound recently debrided with some active bleeding HEENT: No complaint RESPIRATORY: No cough, wheezing, SOB CARDIAC: No chest pain, palpitations, lower extremity edema  GI: No abdominal pain, No N/V/D or constipation, No heartburn or reflux  GU: No dysuria, frequency or urgency, or incontinence  MUSCULOSKELETAL: + bone/joint pain but apparently controlled on current medications NEUROLOGIC: No headache, dizziness  PSYCHIATRIC: No overt anxiety or sadness  Filed Vitals:   04/12/15 2023  BP: 132/69  Pulse: 64  Temp: 98.1 F (36.7 C)  Resp: 20    Physical Exam  GENERAL APPEARANCE: Alert, conversant, appears comfortable SKIN: No diaphoresis rash, ischial wound has a moderate amount of active bleeding it is soaking his diaper  HEENT: Unremarkable oropharynx is clear RESPIRATORY: Breathing is even, unlabored. Lung sounds are clear   CARDIOVASCULAR: Heart RRR with occasional irregular beats no murmurs,  rubs or gallops. No peripheral edema  GASTROINTESTINAL: Abdomen is soft, non-tender, not distended w/ normal bowel sounds.  GENITOURINARY: Bladder non tender, not distended  MUSCULOSKELETAL: No abnormal joints or musculature has general frailty is able to move extremities 4 NEUROLOGIC: Cranial nerves 2-12 grossly intact.  PSYCHIATRIC: Mood and affect appropriate, , no behavioral issues he  is pleasant  Patient Active Problem List   Diagnosis Date Noted  . Palliative care encounter 04/08/2015  . Decubitus ulcer   . Pressure ulcer 04/07/2015  . Urinary tract infection 04/06/2015  . Sepsis ([place]) 04/06/2015  . Osteomyelitis of foot ([place]) 03/04/2015  . FTT (failure to thrive) in adult 03/04/2015  . Venous stasis ulcers of both lower extremities 02/20/2015  . Anemia in chronic kidney disease 02/20/2015  . Pain of toes of both feet 01/20/2015  . Localized swelling of both lower legs 01/20/2015  . Persistent atrial fibrillation ([place]) 12/10/2014  . Dementia 12/10/2014  . Bradycardia 12/10/2014  . Bilateral edema of lower extremity 12/10/2014  . Chronic diastolic heart failure ([place]) 12/10/2014  . Venous stasis dermatitis of both lower extremities 11/04/2014  . Acute renal failure syndrome ([place])   . SOB (shortness of breath)   . Esophageal reflux   . Atrial flutter ([place]) 11/01/2014  . Acute on chronic diastolic CHF (congestive heart failure), NYHA class 2 ([place]) 11/01/2014  . Hypertensive heart disease with CHF (congestive heart failure) ([place]) 11/01/2014  . CKD (chronic kidney disease) stage 3, GFR 30-59 ml/min 11/01/2014  . Dementia with behavioral disturbance 11/01/2014  . CHF (congestive heart failure) ([place]) 11/01/2014  . Atrial flutter, unspecified   . BPH (benign prostatic hyperplasia)   . Colon cancer (HCC)     CBC    Component Value Date/Time   WBC 10.4 04/09/2015 0347   RBC 3.01* 04/09/2015 0347   RBC 3.26* 04/08/2015 1027   HGB 8.2* 04/09/2015 0347   HCT 26.7* 04/09/2015 0347   PLT 347 04/09/2015 0347   MCV 88.7 04/09/2015 0347   LYMPHSABS 1.3 04/07/2015 0525   MONOABS 1.5* 04/07/2015 0525   EOSABS 0.1 04/07/2015 0525   BASOSABS 0.0 04/07/2015 0525    CMP     Component Value Date/Time   NA 134* 04/09/2015 0347   K 3.9 04/09/2015 0347   CL 102 04/09/2015 0347   CO2 25 04/09/2015 0347   GLUCOSE 117* 04/09/2015 0347   BUN 54* 04/09/2015 0347    CREATININE 1.52* 04/09/2015 0347   CALCIUM 8.0* 04/09/2015 0347   PROT 6.2* 04/07/2015 0525   ALBUMIN 2.2* 04/07/2015 0525   AST 11* 04/07/2015 0525   ALT 9* 04/07/2015 0525   ALKPHOS 53 04/07/2015 0525   BILITOT 0.5 04/07/2015 0525   GFRNONAA 38* 04/09/2015 0347   GFRAA 44* 04/09/2015 0347    Assessment and Plan   bleeding from ischial wound site-this is quite active and is still bleeding after a significant amount of time currently he is not in any distress but I suspect he's been losing somewhat significant amount of blood -- his vital signs are stable serial exams before EMS arrived show that he was stable and comfortable with no complaints of increased dizziness shortness of breath or weakness from baseline---I did discuss his status with his son via phone-his son desires only comfort care-however this active bleeding is a concern and I suspect will warrant hospital evaluation to try to get this stopped--and his son did express understanding.  [id number]     Baldo Hufnagle C,

## 2015-04-21 ENCOUNTER — Non-Acute Institutional Stay (SKILLED_NURSING_FACILITY): Payer: Medicare Other | Admitting: Internal Medicine

## 2015-04-21 ENCOUNTER — Encounter: Payer: Self-pay | Admitting: Internal Medicine

## 2015-04-21 DIAGNOSIS — K219 Gastro-esophageal reflux disease without esophagitis: Secondary | ICD-10-CM

## 2015-04-21 DIAGNOSIS — L8990 Pressure ulcer of unspecified site, unspecified stage: Secondary | ICD-10-CM

## 2015-04-21 DIAGNOSIS — F0391 Unspecified dementia with behavioral disturbance: Secondary | ICD-10-CM | POA: Diagnosis not present

## 2015-04-21 DIAGNOSIS — N183 Chronic kidney disease, stage 3 unspecified: Secondary | ICD-10-CM

## 2015-04-21 DIAGNOSIS — D638 Anemia in other chronic diseases classified elsewhere: Secondary | ICD-10-CM

## 2015-04-21 DIAGNOSIS — N189 Chronic kidney disease, unspecified: Secondary | ICD-10-CM

## 2015-04-21 DIAGNOSIS — N400 Enlarged prostate without lower urinary tract symptoms: Secondary | ICD-10-CM

## 2015-04-21 DIAGNOSIS — D631 Anemia in chronic kidney disease: Secondary | ICD-10-CM | POA: Diagnosis not present

## 2015-04-21 DIAGNOSIS — F03918 Unspecified dementia, unspecified severity, with other behavioral disturbance: Secondary | ICD-10-CM

## 2015-04-21 DIAGNOSIS — N3000 Acute cystitis without hematuria: Secondary | ICD-10-CM | POA: Diagnosis not present

## 2015-04-21 DIAGNOSIS — I5022 Chronic systolic (congestive) heart failure: Secondary | ICD-10-CM | POA: Diagnosis not present

## 2015-04-21 DIAGNOSIS — I739 Peripheral vascular disease, unspecified: Secondary | ICD-10-CM

## 2015-04-21 NOTE — Progress Notes (Signed)
MRN: [id number] Name: Jontay Maston  Sex: male Age: 81 y.o. DOB: Jul 02, 1919  [place] [facility]: [facility] Facility/Room: Level Of Care: SNF Provider: Inocencio Homes D Emergency Contacts: Extended Emergency Contact Information Primary Emergency Contact: [facility] Address: [address] [place], [place] [address] Johnnette Litter of [place] Phone: [phone] Relation: Daughter Secondary Emergency Contact: Daiva Huge [place] of [place] Phone: [phone] Relation: Son  Code Status:   Allergies: Review of patient's allergies indicates no known allergies.  Chief Complaint  Patient presents with  . Readmit To SNF    HPI: Patient is 81 y.o. male with dementia,diastolic HF,BPH, atrial flutter ( off AC due to falls),colon cancer presented to the Ed due to bleeding on decub ulcer site post debridement, and was found to have pus with in and out cath. He was admitted 10/17-21 for urinary tract infection. Bleeding from decub site stopped and pt did not require tx. Pt is admitted to SNF for supportive care of stage 3 decubitus. He will be followed for his anemia, tx with iron, dementia tx with depakote, trazodone and lexapro, and BPH tx with flomax.   Past Medical History  Diagnosis Date  . Essential hypertension   . Colon cancer ([place])   . Atrial flutter ([place])   . Dementia   . BPH (benign prostatic hyperplasia)   . Diastolic heart failure ([place])   . Osteomyelitis of ankle and foot [facility]     Past Surgical History  Procedure Laterality Date  . Colon surgery        Medication List       This list is accurate as of: 04/21/15 11:59 PM.  Always use your most recent med list.               acetaminophen 500 MG tablet  Commonly known as:  TYLENOL  Take 500 mg by mouth every 6 (six) hours as needed for mild pain.     ampicillin 500 MG capsule  Commonly known as:  PRINCIPEN  Take 1 capsule (500 mg total) by mouth every 8 (eight) hours.     aspirin 81 MG EC  tablet  Take 1 tablet (81 mg total) by mouth daily.     collagenase ointment  Commonly known as:  SANTYL  Apply topically daily.     divalproex 125 MG capsule  Commonly known as:  DEPAKOTE SPRINKLE  Take 125 mg by mouth daily.     escitalopram 10 MG tablet  Commonly known as:  LEXAPRO  Take 10 mg by mouth daily.     furosemide 40 MG tablet  Commonly known as:  LASIX  Take 1 tablet (40 mg total) by mouth 2 (two) times daily.     HYDROcodone-acetaminophen 5-325 MG tablet  Commonly known as:  NORCO/VICODIN  Take one tablet by mouth every 6 hours for pain. Hold for somnolence/drowiness     iron polysaccharides 150 MG capsule  Commonly known as:  NIFEREX  Take 1 capsule (150 mg total) by mouth daily.     JUVEN REVIGOR Pack  Take 1 packet by mouth 2 (two) times daily.     feeding supplement (ENSURE ENLIVE) Liqd  Take 237 mLs by mouth 2 (two) times daily between meals.     LORazepam 0.5 MG tablet  Commonly known as:  ATIVAN  Take 1 tablet (0.5 mg total) by mouth every 8 (eight) hours as needed for anxiety.     multivitamin with minerals  tablet  Take 1 tablet by mouth daily.     pantoprazole 40 MG tablet  Commonly known as:  PROTONIX  Take 1 tablet (40 mg total) by mouth daily.     tamsulosin 0.4 MG Caps capsule  Commonly known as:  FLOMAX  Take 0.4 mg by mouth daily.     traMADol 50 MG tablet  Commonly known as:  ULTRAM  Take 1 tablet (50 mg total) by mouth every 6 (six) hours as needed.     traZODone 50 MG tablet  Commonly known as:  DESYREL  Take 50 mg by mouth at bedtime.     Vitamin D (Ergocalciferol) 50000 UNITS Caps capsule  Commonly known as:  DRISDOL  Take 50,000 Units by mouth every 7 (seven) days.        No orders of the defined types were placed in this encounter.     There is no immunization history on file for this patient.  Social History  Substance Use Topics  . Smoking status: Never Smoker   . Smokeless tobacco: Never Used  . Alcohol  Use: No    Family history is not obtainable. Pt says he can't remember  Review of Systems  DATA OBTAINED: from patient, nurse- no concerns GENERAL:  no fevers, fatigue, appetite changes SKIN: No itching, rash or wounds EYES: No eye pain, redness, discharge EARS: No earache, tinnitus, change in hearing NOSE: No congestion, drainage or bleeding  MOUTH/THROAT: No mouth or tooth pain, No sore throat RESPIRATORY: No cough, wheezing, SOB CARDIAC: No chest pain, palpitations, lower extremity edema  GI: No abdominal pain, No N/V/D or constipation, No heartburn or reflux  GU: No dysuria, frequency or urgency, or incontinence  MUSCULOSKELETAL: No unrelieved bone/joint pain NEUROLOGIC: No headache, dizziness or focal weakness PSYCHIATRIC: No c/o anxiety or sadness   Filed Vitals:   04/21/15 1529  BP: 122/52  Pulse: 51  Temp: 98.4 F (36.9 C)  Resp: 20    SpO2 Readings from Last 1 Encounters:  04/10/15 96%        Physical Exam  GENERAL APPEARANCE: Alert, conversant,  No acute distress.  SKIN: No diaphoresis rash; sacrum not visualized HEAD: Normocephalic, atraumatic  EYES: Conjunctiva/lids clear. Pupils round, reactive. EOMs intact.  EARS: External exam WNL, canals clear. Hearing grossly normal.  NOSE: No deformity or discharge.  MOUTH/THROAT: Lips w/o lesions  RESPIRATORY: Breathing is even, unlabored. Lung sounds are clear   CARDIOVASCULAR: Heart RRR no murmurs, rubs or gallops.   GASTROINTESTINAL: Abdomen is soft, non-tender, not distended w/ normal bowel sounds. GENITOURINARY: Bladder non tender, not distended  MUSCULOSKELETAL: No abnormal joints or musculature NEUROLOGIC:  Cranial nerves 2-12 grossly intact. Moves all extremities  PSYCHIATRIC: Mood and affect appropriate to situation, he seems clearer today, no behavioral issues  Patient Active Problem List   Diagnosis Date Noted  . Anemia, chronic disease 04/22/2015  . PVD (peripheral vascular disease) ([place])  04/22/2015  . Palliative care encounter 04/08/2015  . Decubitus ulcer   . Pressure ulcer 04/07/2015  . Urinary tract infection 04/06/2015  . Sepsis ([place]) 04/06/2015  . Osteomyelitis of foot ([place]) 03/04/2015  . FTT (failure to thrive) in adult 03/04/2015  . Venous stasis ulcers of both lower extremities 02/20/2015  . Anemia in chronic kidney disease 02/20/2015  . Pain of toes of both feet 01/20/2015  . Localized swelling of both lower legs 01/20/2015  . Persistent atrial fibrillation ([place]) 12/10/2014  . Dementia 12/10/2014  . Bradycardia 12/10/2014  . Bilateral edema of  lower extremity 12/10/2014  . Chronic diastolic heart failure ([place]) 12/10/2014  . Venous stasis dermatitis of both lower extremities 11/04/2014  . Acute renal failure syndrome ([place])   . SOB (shortness of breath)   . Esophageal reflux   . Atrial flutter ([place]) 11/01/2014  . Acute on chronic diastolic CHF (congestive heart failure), NYHA class 2 ([place]) 11/01/2014  . Hypertensive heart disease with CHF (congestive heart failure) ([place]) 11/01/2014  . CKD (chronic kidney disease) stage 3, GFR 30-59 ml/min 11/01/2014  . Dementia with behavioral disturbance 11/01/2014  . CHF (congestive heart failure) ([place]) 11/01/2014  . Atrial flutter, unspecified   . BPH (benign prostatic hyperplasia)   . Colon cancer ([facility])     CBC    Component Value Date/Time   WBC 10.4 04/09/2015 0347   RBC 3.01* 04/09/2015 0347   RBC 3.26* 04/08/2015 1027   HGB 8.2* 04/09/2015 0347   HCT 26.7* 04/09/2015 0347   PLT 347 04/09/2015 0347   MCV 88.7 04/09/2015 0347   LYMPHSABS 1.3 04/07/2015 0525   MONOABS 1.5* 04/07/2015 0525   EOSABS 0.1 04/07/2015 0525   BASOSABS 0.0 04/07/2015 0525    CMP     Component Value Date/Time   NA 134* 04/09/2015 0347   K 3.9 04/09/2015 0347   CL 102 04/09/2015 0347   CO2 25 04/09/2015 0347   GLUCOSE 117* 04/09/2015 0347   BUN 54* 04/09/2015 0347   CREATININE 1.52* 04/09/2015 0347   CALCIUM 8.0* 04/09/2015  0347   PROT 6.2* 04/07/2015 0525   ALBUMIN 2.2* 04/07/2015 0525   AST 11* 04/07/2015 0525   ALT 9* 04/07/2015 0525   ALKPHOS 53 04/07/2015 0525   BILITOT 0.5 04/07/2015 0525   GFRNONAA 38* 04/09/2015 0347   GFRAA 44* 04/09/2015 0347    No results found for: HGBA1C   No results found.  Not all labs, radiology exams or other studies done during hospitalization come through on my EPIC note; however they are reviewed by me.    Assessment and Plan  Urinary tract infection no hematuria, no fever, no tachycardia. Patient is non-toxic in appearance. Do not meet criteria for Sepsis. -will continue IV antibiotics, urine cultures show multiple bacterial morphotypes.  - repeat urine culture ordered and grew 80,000 enterococcus, sensitive to ampicillin.  -no febrile and WBC's improved/resolving .  BPH (benign prostatic hyperplasia) SNF - chronic and stable; cont flomax  Anemia in chronic kidney disease with acute blood loss component from decubitus wound debridement  - drop of 1 gm of hemoglobin from admission. Baseline hemoglobin is around 12, and dropped to 9 on admission and further down to around 8 today. Stabilized around 8.  -no need for transfusion currently -decubitus ulcer is just oozing currently On niferex. - ordered anemia panel, showed low iron levels and low saturation ratio's. Ferritin 314.  SNF - cont niferex  Dementia with behavioral disturbance SNF - stable currently -will continue depakote, lexapro, trazodone    CKD (chronic kidney disease) stage 3, GFR 30-59 ml/min at baseline -due to UTI and dehydration most likely Slightly improved  SNF - will follow with BMP  Decubitus ulcer Family wanted to focus on comfort care; SNF - wound care for comfort, no further debridment  PVD (peripheral vascular disease) ([place]) with left great toe gangrene -not a candidate for intervention -no signs of superimposed infection -patient denies any pain SNF - onitor for  pain or infection  CHF (congestive heart failure) chronic diastolic HF: -will be cautious with fluid resuscitation -no  signs of fluid overload currently SNF - continue lasix -patient last EF 50-55%  Esophageal reflux SNF - cont protonix 40 mg daily   Time spent 45 min;> 50% of time with patient was spent reviewing records, labs, tests and studies, counseling and developing plan of care  Hennie Duos, MD

## 2015-04-22 DIAGNOSIS — I739 Peripheral vascular disease, unspecified: Secondary | ICD-10-CM | POA: Insufficient documentation

## 2015-04-22 DIAGNOSIS — D638 Anemia in other chronic diseases classified elsewhere: Secondary | ICD-10-CM | POA: Insufficient documentation

## 2015-04-22 NOTE — Assessment & Plan Note (Signed)
with left great toe gangrene -not a candidate for intervention -no signs of superimposed infection -patient denies any pain SNF - onitor for pain or infection

## 2015-04-22 NOTE — Assessment & Plan Note (Signed)
Family wanted to focus on comfort care; SNF - wound care for comfort, no further debridment

## 2015-04-22 NOTE — Assessment & Plan Note (Signed)
with acute blood loss component from decubitus wound debridement  - drop of 1 gm of hemoglobin from admission. Baseline hemoglobin is around 12, and dropped to 9 on admission and further down to around 8 today. Stabilized around 8.  -no need for transfusion currently -decubitus ulcer is just oozing currently On niferex. - ordered anemia panel, showed low iron levels and low saturation ratio's. Ferritin 314.  SNF - cont niferex

## 2015-04-22 NOTE — Assessment & Plan Note (Signed)
SNF - cont protonix 40 mg daily

## 2015-04-22 NOTE — Assessment & Plan Note (Signed)
no hematuria, no fever, no tachycardia. Patient is non-toxic in appearance. Do not meet criteria for Sepsis. -will continue IV antibiotics, urine cultures show multiple bacterial morphotypes.  - repeat urine culture ordered and grew 80,000 enterococcus, sensitive to ampicillin.  -no febrile and WBC's improved/resolving .

## 2015-04-22 NOTE — Assessment & Plan Note (Signed)
SNF - chronic and stable; cont flomax

## 2015-04-22 NOTE — Assessment & Plan Note (Signed)
at baseline -due to UTI and dehydration most likely Slightly improved  SNF - will follow with BMP

## 2015-04-22 NOTE — Assessment & Plan Note (Signed)
SNF - stable currently -will continue depakote, lexapro, trazodone

## 2015-04-22 NOTE — Assessment & Plan Note (Signed)
chronic diastolic HF: -will be cautious with fluid resuscitation -no signs of fluid overload currently SNF - continue lasix -patient last EF 50-55%

## 2015-05-20 ENCOUNTER — Encounter: Payer: Self-pay | Admitting: Internal Medicine

## 2015-05-20 ENCOUNTER — Non-Acute Institutional Stay (SKILLED_NURSING_FACILITY): Payer: Medicare Other | Admitting: Internal Medicine

## 2015-05-20 DIAGNOSIS — R627 Adult failure to thrive: Secondary | ICD-10-CM | POA: Diagnosis not present

## 2015-05-20 DIAGNOSIS — M86671 Other chronic osteomyelitis, right ankle and foot: Secondary | ICD-10-CM | POA: Diagnosis not present

## 2015-05-20 DIAGNOSIS — I5022 Chronic systolic (congestive) heart failure: Secondary | ICD-10-CM

## 2015-05-20 NOTE — Assessment & Plan Note (Addendum)
Pt's decline appears to have increased over past 7-10 days;all meds have been d/c except for lasix, ativan morphine and morphine has now been scheduled; pt appears comfrotable and will cont supportive care until the end

## 2015-05-20 NOTE — Progress Notes (Addendum)
MRN: [id number] Name: Kenneth Edwards  Sex: male Age: 81 y.o. DOB: 10-29-1919  [place] [facility] Facility/Room:405 Level Of Care: SNF Provider: Inocencio Homes D Emergency Contacts: Extended Emergency Contact Information Primary Emergency Contact: [facility] Address: [address] [place], [place] [address] Johnnette Litter of [place] Phone: [phone] Relation: Daughter Secondary Emergency Contact: Daiva Huge [place] of [place] Phone: [phone] Relation: Son  Code Status:   Allergies: Review of patient's allergies indicates no known allergies.  Chief Complaint  Patient presents with  . Medical Management of Chronic Issues    HPI: Patient is 81 y.o. male whodementia,diastolic HF,BPH, atrial flutter ( off AC due to falls),colon cancer presented to the Ed due to bleeding on decub ulcer site post debridement, and was found to have pus with in and out cath. He was admitted 10/17-21 for urinary tract infection. Bleeding from decub site stopped and pt did not require tx. Pt was admitted to SNF for supportive care of stage 3 decubitus and has now become Hospice for endstage CHF. Today pt is being seen for routine FTT, CHF and R heel osteomyelitis.  Past Medical History  Diagnosis Date  . Essential hypertension   . Colon cancer ([place])   . Atrial flutter ([place])   . Dementia   . BPH (benign prostatic hyperplasia)   . Diastolic heart failure ([place])   . Osteomyelitis of ankle and foot [facility])     Past Surgical History  Procedure Laterality Date  . Colon surgery        Medication List       This list is accurate as of: 05/20/15 11:59 PM.  Always use your most recent med list.               acetaminophen 500 MG tablet  Commonly known as:  TYLENOL  Take 500 mg by mouth every 6 (six) hours as needed for mild pain.     ampicillin 500 MG capsule  Commonly known as:  PRINCIPEN  Take 1 capsule (500 mg total) by mouth every 8 (eight) hours.     aspirin  81 MG EC tablet  Take 1 tablet (81 mg total) by mouth daily.     collagenase ointment  Commonly known as:  SANTYL  Apply topically daily.     divalproex 125 MG capsule  Commonly known as:  DEPAKOTE SPRINKLE  Take 125 mg by mouth daily.     escitalopram 10 MG tablet  Commonly known as:  LEXAPRO  Take 10 mg by mouth daily.     furosemide 40 MG tablet  Commonly known as:  LASIX  Take 1 tablet (40 mg total) by mouth 2 (two) times daily.     HYDROcodone-acetaminophen 5-325 MG tablet  Commonly known as:  NORCO/VICODIN  Take one tablet by mouth every 6 hours for pain. Hold for somnolence/drowiness     iron polysaccharides 150 MG capsule  Commonly known as:  NIFEREX  Take 1 capsule (150 mg total) by mouth daily.     JUVEN REVIGOR Pack  Take 1 packet by mouth 2 (two) times daily.     feeding supplement (ENSURE ENLIVE) Liqd  Take 237 mLs by mouth 2 (two) times daily between meals.     LORazepam 0.5 MG tablet  Commonly known as:  ATIVAN  Take 1 tablet (0.5 mg total) by mouth every 8 (eight) hours as needed for anxiety.     multivitamin with minerals tablet  Take 1 tablet by mouth daily.     pantoprazole 40 MG tablet  Commonly known as:  PROTONIX  Take 1 tablet (40 mg total) by mouth daily.     tamsulosin 0.4 MG Caps capsule  Commonly known as:  FLOMAX  Take 0.4 mg by mouth daily.     traMADol 50 MG tablet  Commonly known as:  ULTRAM  Take 1 tablet (50 mg total) by mouth every 6 (six) hours as needed.     traZODone 50 MG tablet  Commonly known as:  DESYREL  Take 50 mg by mouth at bedtime.     Vitamin D (Ergocalciferol) 50000 UNITS Caps capsule  Commonly known as:  DRISDOL  Take 50,000 Units by mouth every 7 (seven) days.        No orders of the defined types were placed in this encounter.     There is no immunization history on file for this patient.  Social History  Substance Use Topics  . Smoking status: Never Smoker   . Smokeless tobacco: Never Used  .  Alcohol Use: No    Review of Systems  - UTO 2/2 pt dec LOC; nursing without new concerns  Filed Vitals:   05/20/15 1319  BP: 122/52  Pulse: 60  Temp: 97.3 F (36.3 C)  Resp: 18    Physical Exam  GENERAL APPEARANCE: obtunded, No acute distress  SKIN: No diaphoresis rash; sacral wound and heel wound dressed HEENT: Unremarkable RESPIRATORY: Breathing is even, unlabored. Lung sounds are clear ; open mouthed breathing, with O2  CARDIOVASCULAR: Heart RRR no murmurs, rubs or gallops. No peripheral edema  GASTROINTESTINAL: Abdomen is soft, non-tender, not distended w/ normal bowel sounds.  GENITOURINARY: Bladder non tender, not distended  MUSCULOSKELETAL: No abnormal joints or musculature NEUROLOGIC: Cranial nerves 2-12 grossly intact. Moves all extremities, earlier today pt woke briefly, did not with me PSYCHIATRIC: Mood and affect appropriate to situation  Patient Active Problem List   Diagnosis Date Noted  . Anemia, chronic disease 04/22/2015  . PVD (peripheral vascular disease) ([place]) 04/22/2015  . Palliative care encounter 04/08/2015  . Decubitus ulcer   . Pressure ulcer 04/07/2015  . Urinary tract infection 04/06/2015  . Sepsis ([place]) 04/06/2015  . Osteomyelitis of foot ([place]) 03/04/2015  . FTT (failure to thrive) in adult 03/04/2015  . Venous stasis ulcers of both lower extremities 02/20/2015  . Anemia in chronic kidney disease 02/20/2015  . Pain of toes of both feet 01/20/2015  . Localized swelling of both lower legs 01/20/2015  . Persistent atrial fibrillation ([place]) 12/10/2014  . Dementia 12/10/2014  . Bradycardia 12/10/2014  . Bilateral edema of lower extremity 12/10/2014  . Chronic diastolic heart failure ([place]) 12/10/2014  . Venous stasis dermatitis of both lower extremities 11/04/2014  . Acute renal failure syndrome ([place])   . SOB (shortness of breath)   . Esophageal reflux   . Atrial flutter ([place]) 11/01/2014  . Acute on chronic diastolic CHF (congestive heart  failure), NYHA class 2 ([place]) 11/01/2014  . Hypertensive heart disease with CHF (congestive heart failure) ([place]) 11/01/2014  . CKD (chronic kidney disease) stage 3, GFR 30-59 ml/min 11/01/2014  . Dementia with behavioral disturbance 11/01/2014  . CHF (congestive heart failure) ([place]) 11/01/2014  . Atrial flutter, unspecified   . BPH (benign prostatic hyperplasia)   . Colon cancer ([place])     CBC    Component Value Date/Time   WBC 10.4 04/09/2015 0347   RBC 3.01* 04/09/2015 0347   RBC 3.26*  04/08/2015 1027   HGB 8.2* 04/09/2015 0347   HCT 26.7* 04/09/2015 0347   PLT 347 04/09/2015 0347   MCV 88.7 04/09/2015 0347   LYMPHSABS 1.3 04/07/2015 0525   MONOABS 1.5* 04/07/2015 0525   EOSABS 0.1 04/07/2015 0525   BASOSABS 0.0 04/07/2015 0525    CMP     Component Value Date/Time   NA 134* 04/09/2015 0347   K 3.9 04/09/2015 0347   CL 102 04/09/2015 0347   CO2 25 04/09/2015 0347   GLUCOSE 117* 04/09/2015 0347   BUN 54* 04/09/2015 0347   CREATININE 1.52* 04/09/2015 0347   CALCIUM 8.0* 04/09/2015 0347   PROT 6.2* 04/07/2015 0525   ALBUMIN 2.2* 04/07/2015 0525   AST 11* 04/07/2015 0525   ALT 9* 04/07/2015 0525   ALKPHOS 53 04/07/2015 0525   BILITOT 0.5 04/07/2015 0525   GFRNONAA 38* 04/09/2015 0347   GFRAA 44* 04/09/2015 0347    Assessment and Plan  CHF (congestive heart failure) Pt is stable on lasix 80 mg BID and O2;there is no peripheral edema, but  does have anasarca which is why his lasix was inc;  pt looks comfortable.  Osteomyelitis of foot Pt ois getting local wound care but osteo was not treated per family wishes. Pt does not appear in pain  FTT (failure to thrive) in adult Pt's decline appears to have increased over past 7-10 days;all meds have been d/c except for lasix, ativan morphine and morphine has now been scheduled; pt appears comfrotable and will cont supportive care until the end    Hennie Duos, MD

## 2015-05-20 NOTE — Assessment & Plan Note (Signed)
Pt ois getting local wound care but osteo was not treated per family wishes. Pt does not appear in pain

## 2015-05-20 NOTE — Assessment & Plan Note (Addendum)
Pt is stable on lasix 80 mg BID and O2;there is no peripheral edema, but  does have anasarca which is why his lasix was inc;  pt looks comfortable.

## 2015-05-26 ENCOUNTER — Encounter: Payer: Self-pay | Admitting: Internal Medicine

## 2015-05-28 DEATH — deceased

## 2017-03-16 IMAGING — CR DG CHEST 2V
3 series · 3 of 3 positions shown · non-contrast
Comparison: 05/17/2009.

CLINICAL DATA: Bilateral leg swelling for 1 month. Open wounds.
History of colon cancer. Ex-smoker.

EXAM:
CHEST  2 VIEW

[w chest lat]
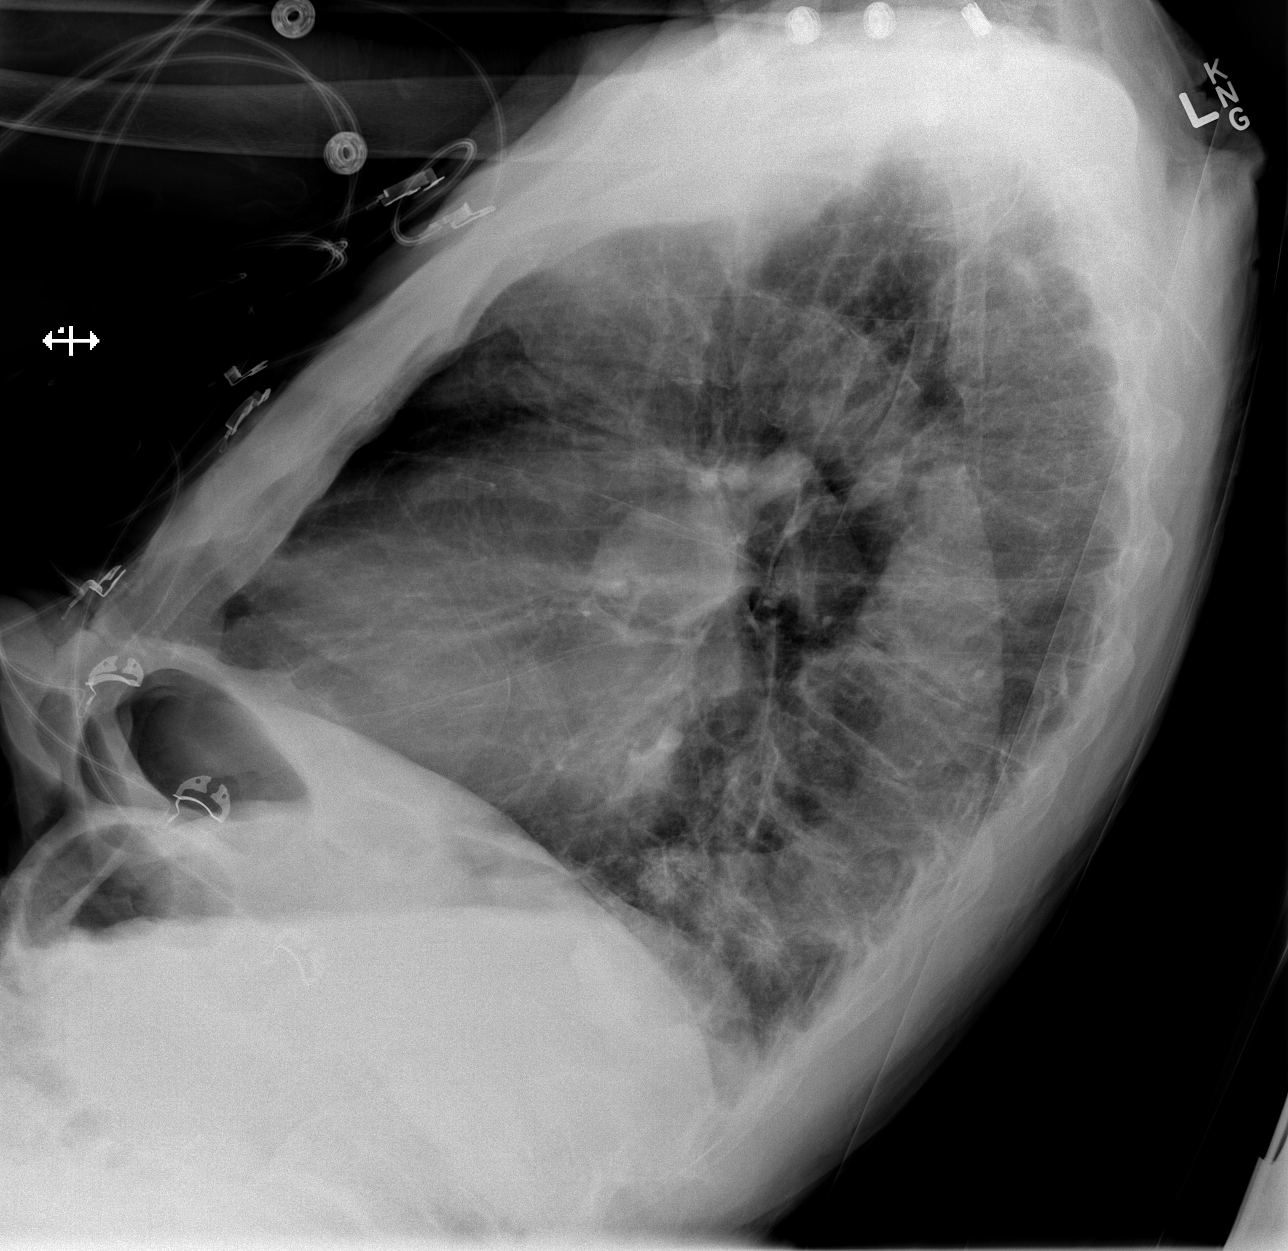

[x chest ap (1 of 2)]
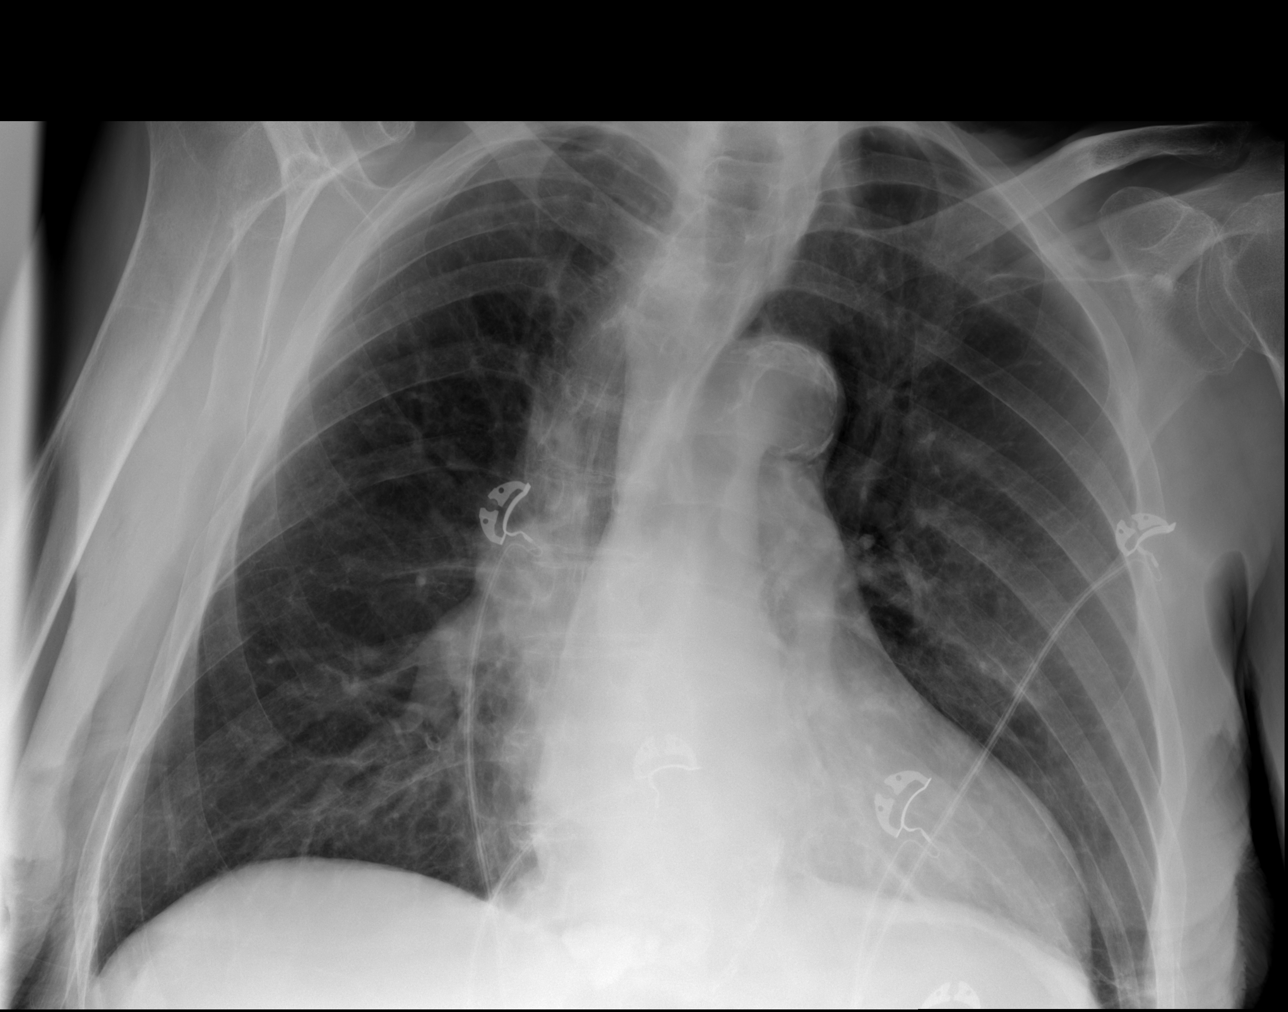

[x chest ap (2 of 2)]
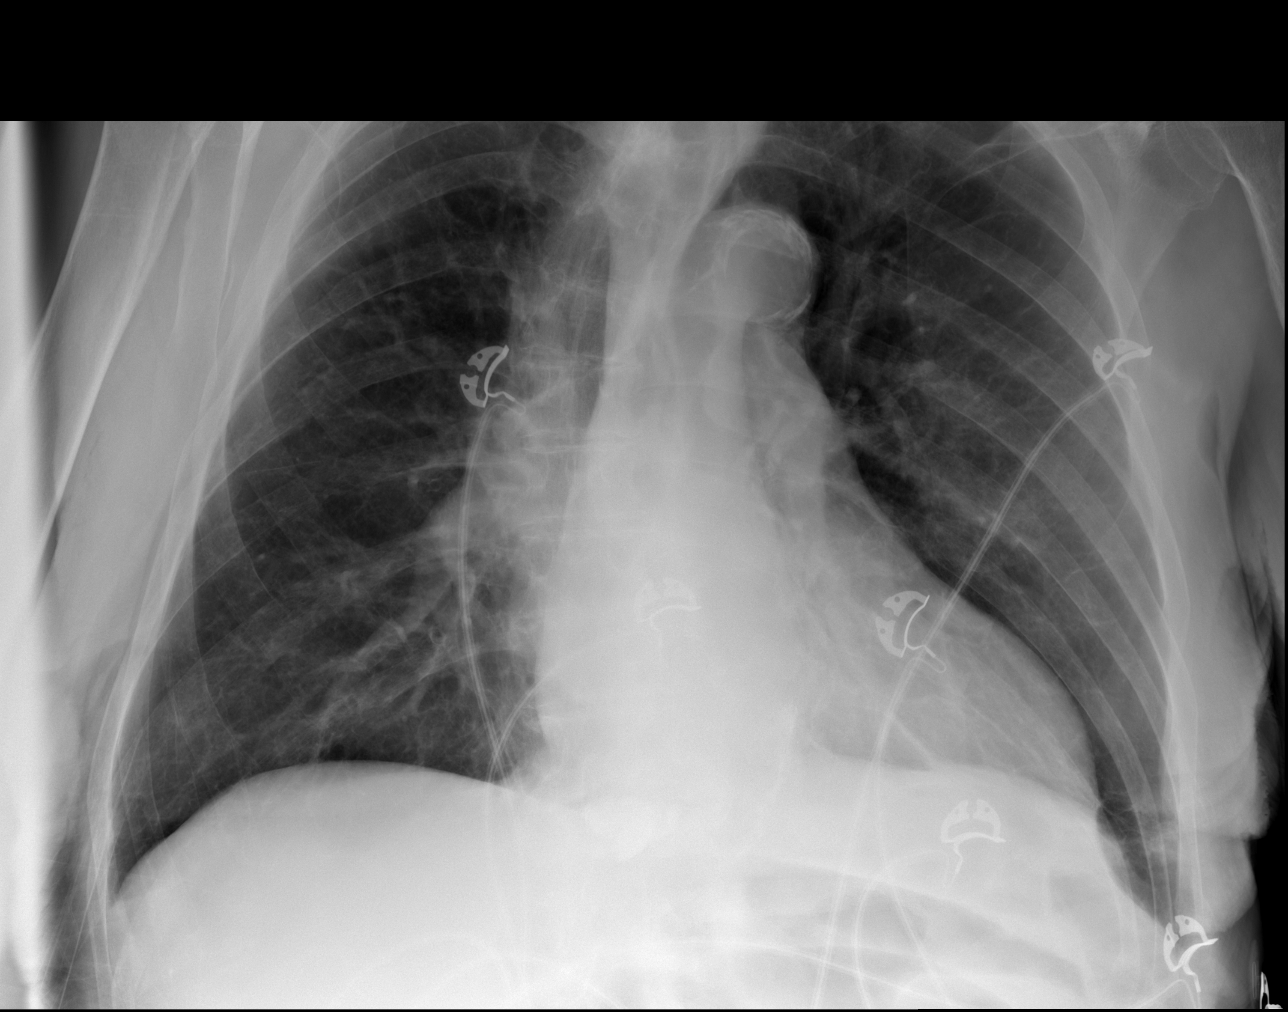

[3 of 3 positions shown; findings below may reference images not displayed]

FINDINGS: The cardiac silhouette remains near the upper limit of normal in
size. Clear lungs with the exception of stable minimal linear
scarring at the left lateral lung base. Moderate scoliosis is
unchanged. Interval thoracolumbar vertebroplasty material.
IMPRESSION: No acute abnormality.
